# Patient Record
Sex: Female | Born: 1950
Health system: Southern US, Community
[De-identification: ages and names within clinical notes are randomized; demographics above are authoritative.]

## PROBLEM LIST (undated history)

## (undated) DIAGNOSIS — E049 Nontoxic goiter, unspecified: Secondary | ICD-10-CM

## (undated) DIAGNOSIS — C7A012 Malignant carcinoid tumor of the ileum: Secondary | ICD-10-CM

## (undated) HISTORY — PX: APPENDECTOMY: SHX54

## (undated) HISTORY — PX: COLPOSCOPY: SHX161

## (undated) HISTORY — DX: Nontoxic goiter, unspecified: E04.9

## (undated) HISTORY — DX: Malignant carcinoid tumor of the ileum: C7A.012

## (undated) HISTORY — PX: THYROIDECTOMY: SHX17

## (undated) HISTORY — PX: OVARIAN CYST REMOVAL: SHX89

---

## 2004-08-21 ENCOUNTER — Encounter: Admission: RE | Admit: 2004-08-21 | Discharge: 2004-08-21 | Payer: Self-pay | Admitting: Obstetrics and Gynecology

## 2004-09-28 ENCOUNTER — Ambulatory Visit (HOSPITAL_COMMUNITY): Admission: RE | Admit: 2004-09-28 | Discharge: 2004-09-28 | Payer: Self-pay | Admitting: Endocrinology

## 2005-09-30 ENCOUNTER — Encounter: Admission: RE | Admit: 2005-09-30 | Discharge: 2005-09-30 | Payer: Self-pay | Admitting: Obstetrics and Gynecology

## 2006-10-02 ENCOUNTER — Encounter: Admission: RE | Admit: 2006-10-02 | Discharge: 2006-10-02 | Payer: Self-pay | Admitting: Obstetrics and Gynecology

## 2007-10-05 ENCOUNTER — Encounter: Admission: RE | Admit: 2007-10-05 | Discharge: 2007-10-05 | Payer: Self-pay | Admitting: Obstetrics and Gynecology

## 2007-10-19 ENCOUNTER — Encounter: Admission: RE | Admit: 2007-10-19 | Discharge: 2007-10-19 | Payer: Self-pay | Admitting: Gastroenterology

## 2007-11-18 ENCOUNTER — Encounter: Admission: RE | Admit: 2007-11-18 | Discharge: 2007-11-18 | Payer: Self-pay | Admitting: Obstetrics and Gynecology

## 2007-11-24 ENCOUNTER — Encounter: Admission: RE | Admit: 2007-11-24 | Discharge: 2007-11-24 | Payer: Self-pay | Admitting: Internal Medicine

## 2007-12-10 ENCOUNTER — Other Ambulatory Visit: Admission: RE | Admit: 2007-12-10 | Discharge: 2007-12-10 | Payer: Self-pay | Admitting: Interventional Radiology

## 2007-12-10 ENCOUNTER — Encounter: Admission: RE | Admit: 2007-12-10 | Discharge: 2007-12-10 | Payer: Self-pay | Admitting: Internal Medicine

## 2007-12-10 ENCOUNTER — Encounter (INDEPENDENT_AMBULATORY_CARE_PROVIDER_SITE_OTHER): Payer: Self-pay | Admitting: Interventional Radiology

## 2008-01-11 ENCOUNTER — Encounter (INDEPENDENT_AMBULATORY_CARE_PROVIDER_SITE_OTHER): Payer: Self-pay | Admitting: General Surgery

## 2008-01-11 ENCOUNTER — Inpatient Hospital Stay (HOSPITAL_COMMUNITY): Admission: RE | Admit: 2008-01-11 | Discharge: 2008-01-16 | Payer: Self-pay | Admitting: General Surgery

## 2008-01-29 ENCOUNTER — Ambulatory Visit: Payer: Self-pay | Admitting: Hematology and Oncology

## 2008-02-05 LAB — CBC WITH DIFFERENTIAL/PLATELET
BASO%: 0.6 % (ref 0.0–2.0)
Basophils Absolute: 0 10*3/uL (ref 0.0–0.1)
EOS%: 1.6 % (ref 0.0–7.0)
Eosinophils Absolute: 0.1 10*3/uL (ref 0.0–0.5)
HCT: 37.3 % (ref 34.8–46.6)
HGB: 12.7 g/dL (ref 11.6–15.9)
LYMPH%: 30.4 % (ref 14.0–48.0)
MCH: 31.1 pg (ref 26.0–34.0)
MCHC: 34.1 g/dL (ref 32.0–36.0)
MCV: 91.1 fL (ref 81.0–101.0)
MONO#: 0.5 10*3/uL (ref 0.1–0.9)
MONO%: 7.8 % (ref 0.0–13.0)
NEUT#: 3.7 10*3/uL (ref 1.5–6.5)
NEUT%: 59.6 % (ref 39.6–76.8)
Platelets: 238 10*3/uL (ref 145–400)
RBC: 4.09 10*6/uL (ref 3.70–5.32)
RDW: 13.4 % (ref 11.3–14.5)
WBC: 6.2 10*3/uL (ref 3.9–10.0)
lymph#: 1.9 10*3/uL (ref 0.9–3.3)

## 2008-02-05 LAB — COMPREHENSIVE METABOLIC PANEL
ALT: 15 U/L (ref 0–35)
AST: 14 U/L (ref 0–37)
Albumin: 4.4 g/dL (ref 3.5–5.2)
Alkaline Phosphatase: 60 U/L (ref 39–117)
BUN: 8 mg/dL (ref 6–23)
CO2: 25 mEq/L (ref 19–32)
Calcium: 9 mg/dL (ref 8.4–10.5)
Chloride: 105 mEq/L (ref 96–112)
Creatinine, Ser: 0.76 mg/dL (ref 0.40–1.20)
Glucose, Bld: 86 mg/dL (ref 70–99)
Potassium: 3.7 mEq/L (ref 3.5–5.3)
Sodium: 137 mEq/L (ref 135–145)
Total Bilirubin: 0.4 mg/dL (ref 0.3–1.2)
Total Protein: 7.5 g/dL (ref 6.0–8.3)

## 2008-02-08 ENCOUNTER — Ambulatory Visit (HOSPITAL_COMMUNITY): Admission: RE | Admit: 2008-02-08 | Discharge: 2008-02-08 | Payer: Self-pay | Admitting: Hematology and Oncology

## 2008-02-11 LAB — 5 HIAA, QUANTITATIVE, URINE, 24 HOUR
5-HIAA, 24 Hr Urine: 2.4 mg/24 h (ref <=6.0)
Volume, Urine-5HIAA: 1400 mL/24 h

## 2008-02-23 ENCOUNTER — Ambulatory Visit (HOSPITAL_COMMUNITY): Admission: RE | Admit: 2008-02-23 | Discharge: 2008-02-23 | Payer: Self-pay | Admitting: Hematology and Oncology

## 2008-02-26 ENCOUNTER — Encounter: Admission: RE | Admit: 2008-02-26 | Discharge: 2008-02-26 | Payer: Self-pay | Admitting: Gastroenterology

## 2008-03-06 ENCOUNTER — Emergency Department (HOSPITAL_COMMUNITY): Admission: EM | Admit: 2008-03-06 | Discharge: 2008-03-06 | Payer: Self-pay | Admitting: Emergency Medicine

## 2008-07-26 ENCOUNTER — Ambulatory Visit: Payer: Self-pay | Admitting: Hematology and Oncology

## 2008-07-28 LAB — COMPREHENSIVE METABOLIC PANEL
ALT: 12 U/L (ref 0–35)
AST: 16 U/L (ref 0–37)
Albumin: 4.3 g/dL (ref 3.5–5.2)
Alkaline Phosphatase: 63 U/L (ref 39–117)
BUN: 12 mg/dL (ref 6–23)
CO2: 27 mEq/L (ref 19–32)
Calcium: 9.3 mg/dL (ref 8.4–10.5)
Chloride: 103 mEq/L (ref 96–112)
Creatinine, Ser: 0.76 mg/dL (ref 0.40–1.20)
Glucose, Bld: 87 mg/dL (ref 70–99)
Potassium: 4.2 mEq/L (ref 3.5–5.3)
Sodium: 139 mEq/L (ref 135–145)
Total Bilirubin: 0.6 mg/dL (ref 0.3–1.2)
Total Protein: 7.3 g/dL (ref 6.0–8.3)

## 2008-07-28 LAB — CBC WITH DIFFERENTIAL/PLATELET
BASO%: 0.2 % (ref 0.0–2.0)
Basophils Absolute: 0 10*3/uL (ref 0.0–0.1)
EOS%: 1.3 % (ref 0.0–7.0)
Eosinophils Absolute: 0.1 10*3/uL (ref 0.0–0.5)
HCT: 37.3 % (ref 34.8–46.6)
HGB: 12.9 g/dL (ref 11.6–15.9)
LYMPH%: 35.2 % (ref 14.0–49.7)
MCH: 30.6 pg (ref 25.1–34.0)
MCHC: 34.6 g/dL (ref 31.5–36.0)
MCV: 88.6 fL (ref 79.5–101.0)
MONO#: 0.5 10*3/uL (ref 0.1–0.9)
MONO%: 8.9 % (ref 0.0–14.0)
NEUT#: 2.9 10*3/uL (ref 1.5–6.5)
NEUT%: 54.4 % (ref 38.4–76.8)
Platelets: 214 10*3/uL (ref 145–400)
RBC: 4.21 10*6/uL (ref 3.70–5.45)
RDW: 13.2 % (ref 11.2–14.5)
WBC: 5.3 10*3/uL (ref 3.9–10.3)
lymph#: 1.9 10*3/uL (ref 0.9–3.3)

## 2008-07-28 LAB — LACTATE DEHYDROGENASE: LDH: 163 U/L (ref 94–250)

## 2008-07-31 LAB — 5 HIAA, QUANTITATIVE, URINE, 24 HOUR
5-HIAA, 24 Hr Urine: 4 mg/24 h (ref <=6.0)
Volume, Urine-5HIAA: 1500 mL/24 h

## 2008-08-08 ENCOUNTER — Ambulatory Visit (HOSPITAL_COMMUNITY): Admission: RE | Admit: 2008-08-08 | Discharge: 2008-08-08 | Payer: Self-pay | Admitting: Hematology and Oncology

## 2008-10-07 ENCOUNTER — Encounter: Admission: RE | Admit: 2008-10-07 | Discharge: 2008-10-07 | Payer: Self-pay | Admitting: Obstetrics and Gynecology

## 2009-02-02 ENCOUNTER — Ambulatory Visit: Payer: Self-pay | Admitting: Hematology and Oncology

## 2009-02-06 ENCOUNTER — Ambulatory Visit (HOSPITAL_COMMUNITY): Admission: RE | Admit: 2009-02-06 | Discharge: 2009-02-06 | Payer: Self-pay | Admitting: Hematology and Oncology

## 2009-02-06 LAB — CBC WITH DIFFERENTIAL/PLATELET
BASO%: 0.2 % (ref 0.0–2.0)
Basophils Absolute: 0 10*3/uL (ref 0.0–0.1)
EOS%: 1.7 % (ref 0.0–7.0)
Eosinophils Absolute: 0.1 10*3/uL (ref 0.0–0.5)
HCT: 38.6 % (ref 34.8–46.6)
HGB: 13.1 g/dL (ref 11.6–15.9)
LYMPH%: 32 % (ref 14.0–49.7)
MCH: 30.6 pg (ref 25.1–34.0)
MCHC: 33.9 g/dL (ref 31.5–36.0)
MCV: 90.2 fL (ref 79.5–101.0)
MONO#: 0.4 10*3/uL (ref 0.1–0.9)
MONO%: 9.4 % (ref 0.0–14.0)
NEUT#: 2.3 10*3/uL (ref 1.5–6.5)
NEUT%: 56.7 % (ref 38.4–76.8)
Platelets: 211 10*3/uL (ref 145–400)
RBC: 4.28 10*6/uL (ref 3.70–5.45)
RDW: 13 % (ref 11.2–14.5)
WBC: 4.1 10*3/uL (ref 3.9–10.3)
lymph#: 1.3 10*3/uL (ref 0.9–3.3)

## 2009-02-06 LAB — COMPREHENSIVE METABOLIC PANEL
ALT: 20 U/L (ref 0–35)
AST: 20 U/L (ref 0–37)
Albumin: 3.8 g/dL (ref 3.5–5.2)
Alkaline Phosphatase: 61 U/L (ref 39–117)
BUN: 6 mg/dL (ref 6–23)
CO2: 30 mEq/L (ref 19–32)
Calcium: 9.5 mg/dL (ref 8.4–10.5)
Chloride: 106 mEq/L (ref 96–112)
Creatinine, Ser: 0.77 mg/dL (ref 0.40–1.20)
Glucose, Bld: 77 mg/dL (ref 70–99)
Potassium: 3.9 mEq/L (ref 3.5–5.3)
Sodium: 142 mEq/L (ref 135–145)
Total Bilirubin: 0.7 mg/dL (ref 0.3–1.2)
Total Protein: 7.7 g/dL (ref 6.0–8.3)

## 2009-02-09 LAB — 5 HIAA, QUANTITATIVE, URINE, 24 HOUR
5-HIAA, 24 Hr Urine: 3.7 mg/24 h (ref <=6.0)
Volume, Urine-5HIAA: 1325 mL/24 h

## 2009-02-15 LAB — CHROMOGRANIN A: Chromogranin A: 4.6 ng/mL (ref 1.9–15.0)

## 2009-04-26 ENCOUNTER — Ambulatory Visit: Payer: Self-pay | Admitting: Hematology and Oncology

## 2009-05-03 LAB — CBC WITH DIFFERENTIAL/PLATELET
BASO%: 0.4 % (ref 0.0–2.0)
Basophils Absolute: 0 10*3/uL (ref 0.0–0.1)
EOS%: 2.5 % (ref 0.0–7.0)
Eosinophils Absolute: 0.1 10*3/uL (ref 0.0–0.5)
HCT: 35.9 % (ref 34.8–46.6)
HGB: 12.4 g/dL (ref 11.6–15.9)
LYMPH%: 29.9 % (ref 14.0–49.7)
MCH: 32.3 pg (ref 25.1–34.0)
MCHC: 34.5 g/dL (ref 31.5–36.0)
MCV: 93.7 fL (ref 79.5–101.0)
MONO#: 0.5 10*3/uL (ref 0.1–0.9)
MONO%: 10.5 % (ref 0.0–14.0)
NEUT#: 2.6 10*3/uL (ref 1.5–6.5)
NEUT%: 56.7 % (ref 38.4–76.8)
Platelets: 226 10*3/uL (ref 145–400)
RBC: 3.83 10*6/uL (ref 3.70–5.45)
RDW: 13.4 % (ref 11.2–14.5)
WBC: 4.6 10*3/uL (ref 3.9–10.3)
lymph#: 1.4 10*3/uL (ref 0.9–3.3)

## 2009-05-08 LAB — COMPREHENSIVE METABOLIC PANEL
ALT: 16 U/L (ref 0–35)
AST: 15 U/L (ref 0–37)
Albumin: 3.9 g/dL (ref 3.5–5.2)
Alkaline Phosphatase: 55 U/L (ref 39–117)
BUN: 11 mg/dL (ref 6–23)
CO2: 25 mEq/L (ref 19–32)
Calcium: 8.8 mg/dL (ref 8.4–10.5)
Chloride: 106 mEq/L (ref 96–112)
Creatinine, Ser: 0.71 mg/dL (ref 0.40–1.20)
Glucose, Bld: 82 mg/dL (ref 70–99)
Potassium: 4 mEq/L (ref 3.5–5.3)
Sodium: 138 mEq/L (ref 135–145)
Total Bilirubin: 0.6 mg/dL (ref 0.3–1.2)
Total Protein: 7 g/dL (ref 6.0–8.3)

## 2009-05-08 LAB — CHROMOGRANIN A: Chromogranin A: 4.2 ng/mL (ref 1.9–15.0)

## 2009-05-11 LAB — 5 HIAA, QUANTITATIVE, URINE, 24 HOUR
5-HIAA, 24 Hr Urine: 5.8 mg/24 h (ref <=6.0)
Volume, Urine-5HIAA: 800 mL/24 h

## 2009-07-14 ENCOUNTER — Ambulatory Visit: Payer: Self-pay | Admitting: Hematology and Oncology

## 2009-07-18 ENCOUNTER — Ambulatory Visit (HOSPITAL_COMMUNITY): Admission: RE | Admit: 2009-07-18 | Discharge: 2009-07-18 | Payer: Self-pay | Admitting: Hematology and Oncology

## 2009-07-18 LAB — COMPREHENSIVE METABOLIC PANEL
ALT: 24 U/L (ref 0–35)
AST: 37 U/L (ref 0–37)
Albumin: 3.9 g/dL (ref 3.5–5.2)
Alkaline Phosphatase: 57 U/L (ref 39–117)
BUN: 10 mg/dL (ref 6–23)
CO2: 29 mEq/L (ref 19–32)
Calcium: 8.9 mg/dL (ref 8.4–10.5)
Chloride: 103 mEq/L (ref 96–112)
Creatinine, Ser: 0.71 mg/dL (ref 0.40–1.20)
Glucose, Bld: 83 mg/dL (ref 70–99)
Potassium: 3.7 mEq/L (ref 3.5–5.3)
Sodium: 136 mEq/L (ref 135–145)
Total Bilirubin: 0.6 mg/dL (ref 0.3–1.2)
Total Protein: 7.9 g/dL (ref 6.0–8.3)

## 2009-07-18 LAB — CBC WITH DIFFERENTIAL/PLATELET
BASO%: 0.4 % (ref 0.0–2.0)
Basophils Absolute: 0 10*3/uL (ref 0.0–0.1)
EOS%: 0.6 % (ref 0.0–7.0)
Eosinophils Absolute: 0 10*3/uL (ref 0.0–0.5)
HCT: 38.6 % (ref 34.8–46.6)
HGB: 13 g/dL (ref 11.6–15.9)
LYMPH%: 32.5 % (ref 14.0–49.7)
MCH: 31.7 pg (ref 25.1–34.0)
MCHC: 33.8 g/dL (ref 31.5–36.0)
MCV: 94 fL (ref 79.5–101.0)
MONO#: 0.4 10*3/uL (ref 0.1–0.9)
MONO%: 8.1 % (ref 0.0–14.0)
NEUT#: 2.7 10*3/uL (ref 1.5–6.5)
NEUT%: 58.4 % (ref 38.4–76.8)
Platelets: 220 10*3/uL (ref 145–400)
RBC: 4.1 10*6/uL (ref 3.70–5.45)
RDW: 13.8 % (ref 11.2–14.5)
WBC: 4.6 10*3/uL (ref 3.9–10.3)
lymph#: 1.5 10*3/uL (ref 0.9–3.3)

## 2009-07-23 LAB — CHROMOGRANIN A: Chromogranin A: 7 ng/mL (ref 1.9–15.0)

## 2009-08-04 LAB — 5 HIAA, QUANTITATIVE, URINE, 24 HOUR
5-HIAA, 24 Hr Urine: 3.6 mg/24 h (ref <=6.0)
Volume, Urine-5HIAA: 1575 mL/24 h

## 2009-10-10 ENCOUNTER — Encounter: Admission: RE | Admit: 2009-10-10 | Discharge: 2009-10-10 | Payer: Self-pay | Admitting: Obstetrics and Gynecology

## 2010-02-07 ENCOUNTER — Ambulatory Visit: Payer: Self-pay | Admitting: Hematology and Oncology

## 2010-02-15 LAB — 5 HIAA, QUANTITATIVE, URINE, 24 HOUR
5-HIAA, 24 Hr Urine: 4.4 mg/24 h (ref <=6.0)
Volume, Urine-5HIAA: 1750 mL/24 h

## 2010-02-16 LAB — CBC WITH DIFFERENTIAL/PLATELET
BASO%: 0.3 % (ref 0.0–2.0)
Basophils Absolute: 0 10*3/uL (ref 0.0–0.1)
EOS%: 0.8 % (ref 0.0–7.0)
Eosinophils Absolute: 0 10*3/uL (ref 0.0–0.5)
HCT: 37.4 % (ref 34.8–46.6)
HGB: 12.9 g/dL (ref 11.6–15.9)
LYMPH%: 35 % (ref 14.0–49.7)
MCH: 32.2 pg (ref 25.1–34.0)
MCHC: 34.5 g/dL (ref 31.5–36.0)
MCV: 93.4 fL (ref 79.5–101.0)
MONO#: 0.5 10*3/uL (ref 0.1–0.9)
MONO%: 9.7 % (ref 0.0–14.0)
NEUT#: 2.8 10*3/uL (ref 1.5–6.5)
NEUT%: 54.2 % (ref 38.4–76.8)
Platelets: 224 10*3/uL (ref 145–400)
RBC: 4.01 10*6/uL (ref 3.70–5.45)
RDW: 13.6 % (ref 11.2–14.5)
WBC: 5.1 10*3/uL (ref 3.9–10.3)
lymph#: 1.8 10*3/uL (ref 0.9–3.3)

## 2010-02-20 LAB — COMPREHENSIVE METABOLIC PANEL
ALT: 15 U/L (ref 0–35)
AST: 15 U/L (ref 0–37)
Albumin: 4.6 g/dL (ref 3.5–5.2)
Alkaline Phosphatase: 66 U/L (ref 39–117)
BUN: 11 mg/dL (ref 6–23)
CO2: 28 mEq/L (ref 19–32)
Calcium: 9 mg/dL (ref 8.4–10.5)
Chloride: 103 mEq/L (ref 96–112)
Creatinine, Ser: 0.74 mg/dL (ref 0.40–1.20)
Glucose, Bld: 85 mg/dL (ref 70–99)
Potassium: 4 mEq/L (ref 3.5–5.3)
Sodium: 138 mEq/L (ref 135–145)
Total Bilirubin: 0.4 mg/dL (ref 0.3–1.2)
Total Protein: 7.6 g/dL (ref 6.0–8.3)

## 2010-02-20 LAB — CHROMOGRANIN A: Chromogranin A: 10.2 ng/mL (ref 1.9–15.0)

## 2010-02-20 LAB — LACTATE DEHYDROGENASE: LDH: 164 U/L (ref 94–250)

## 2010-03-19 ENCOUNTER — Ambulatory Visit: Payer: Self-pay | Admitting: Hematology and Oncology

## 2010-05-04 ENCOUNTER — Other Ambulatory Visit: Payer: Self-pay | Admitting: Hematology and Oncology

## 2010-05-04 DIAGNOSIS — C7A8 Other malignant neuroendocrine tumors: Secondary | ICD-10-CM

## 2010-08-28 NOTE — Discharge Summary (Signed)
[name], [name] NO.:  [id number]   MEDICAL RECORD NO.:  [id number]          PATIENT TYPE:  INP   LOCATION:  5152                         FACILITY:  [facility]   PHYSICIAN:  Cherylynn Ridges, M.D.    DATE OF BIRTH:  06-Oct-1950   DATE OF ADMISSION:  01/11/2008  DATE OF DISCHARGE:  01/16/2008                               DISCHARGE SUMMARY   DISCHARGE DIAGNOSIS:  Neuroendocrine tumor of the terminal ileum by Dr.  Lindie Spruce.   ADDITIONAL DIAGNOSIS:  None.   She has had surgery.  Principal procedure on this admission was a right  colectomy, partial, by Dr. Lindie Spruce.  She was discharged home in the care  of her family.   Discharge medications include all preop medications in addition to  Vicodin 1-2 every 4 hours as needed for pain.  She is to follow up to  see Dr. Lindie Spruce in 1 week.   BRIEF SUMMARY AND HOSPITAL COURSE:  Samantha Martinez was admitted for what  was known to be a likely neuroendocrine tumor of the terminal ileum,  picked up on endoscopy.  Postoperatively, she did well.  Pathology  demonstrated this to be a neuroendocrine tumor with 1/6 lymph nodes  positive for metastasis.  Postop course was unremarkable.  On postop day  #1, she was mobilizing, taking clear liquids.  On postop day #2, she  continued with clear liquids, was advanced to full liquids by postop day  #3.  By postop day #4, she was put on a soft diet, and she was  discharged home on postop day #5.  She will return to clinic to see me  in 1-2 weeks.      Cherylynn Ridges, M.D.  Electronically Signed     JOW/MEDQ  D:  03/03/2008  T:  03/04/2008  Job:  [id number]   cc:   Jordan Hawks. Elnoria Howard, MD

## 2010-08-28 NOTE — Op Note (Signed)
NAMEDELENN, AHN NO.:  [id number]   MEDICAL RECORD NO.:  [id number]          PATIENT TYPE:  INP   LOCATION:  5152                         FACILITY:  [facility]   PHYSICIAN:  Cherylynn Ridges, M.D.    DATE OF BIRTH:  1950/04/21   DATE OF PROCEDURE:  01/11/2008  DATE OF DISCHARGE:                               OPERATIVE REPORT   PREOPERATIVE DIAGNOSIS:  Carcinoid tumor of the terminal ileum.   POSTOPERATIVE DIAGNOSIS:  Carcinoid tumor of the terminal ileum.   PROCEDURE:  Resection of terminal ileum and cecum with primary  anastomosis.   SURGEON:  Marta Lamas. Lindie Spruce, MD   ASSISTANT:  Anselm Pancoast. Weatherly, MD   ANESTHESIA:  General endotracheal.   ESTIMATED BLOOD LOSS:  Less than 50 mL.   COMPLICATIONS:  None.   CONDITION:  Stable.   INDICATIONS FOR OPERATION:  The patient is a 60 year old female who  during a routine colonoscopy was found to have a carcinoid tumor of the  terminal ileum.  She is not in symptomatic.   FINDINGS:  The patient had a very obvious tumor at the junction of the  terminal ileum at the ileocecal valve.  Resection of the cecum and  terminal ileum were required.   The liver appeared to be without evidence of any tumor.  There was no  obvious pathologic adenopathy.   OPERATION:  The patient was taken to the operating room and placed on  the table in supine position.  After an adequate general endotracheal  anesthetic was administered, she was prepped and draped in usual sterile  manner exposing the right lower quadrant and the midline in the abdomen.   We made a transverse incision from just lateral to the umbilicus  laterally.  We took it down to and through the subcutaneous tissue  through the anterior rectus sheath, the rectus muscle, and then the  posterior rectus sheath; once it had been opened easily and without  event to the peritoneal cavity.   We used primarily Richardson retractors in order to get adequate  visualization  and mobilization of the cecum and the terminal ileum.  This was done without much effort as we mobilized the terminal ileum  where there was no appendix noted.  We mobilized the terminal ileum and  the cecum and immediately noted a sort of dimpling-type tumor at the  junction of the mesentery and the terminal ileum.  We mobilized this  area, came across the terminal ileum distal and proximal to the tumor by  about 10 cm using a GIA-75 stapler and I came across the proximal  ascending colon with the same type of stapler.   The mesentery was taken with a Kelly clamps and 2-0 silk ties.  We  removed the cecum and the terminal ileum and obtained adequate  hemostasis in the mesentery using ties.  We then did a functional side-  to-side and end-to-end anastomosis using a GIA-75 stapler and then a TA-  60 stapler to close off the resulting enterotomy.   The mesentery was closed using 2-0 silk sutures.  There was a single  lymph  node, which seem to pop out of the mesentery, which did not appear  to be pathologic, but because it presented itself easily, it was sent as  a separate specimen.   Once we had performed anastomosis and there was  no twisting in the  bowel at the point of the anastomosis, we irrigated with saline  solution.  We closed the posterior and anterior sheath using running and  looped PDS suture.  There was a #1 looped PDS suture.  Then, the subcu  was irrigated with saline and the skin was injected with 0.5% Marcaine  with epi.  We then closed the skin using running subcuticular stitch of  4-0 Monocryl.  Steri-Strips, Dermabond, and Tegaderm were applied.  All  needle counts, sponge counts, and instrument counts were correct.      Cherylynn Ridges, M.D.  Electronically Signed     JOW/MEDQ  D:  01/11/2008  T:  01/12/2008  Job:  [id number]   cc:   Jordan Hawks. Elnoria Howard, MD

## 2010-09-18 ENCOUNTER — Other Ambulatory Visit: Payer: Self-pay | Admitting: Obstetrics and Gynecology

## 2010-09-18 DIAGNOSIS — Z1231 Encounter for screening mammogram for malignant neoplasm of breast: Secondary | ICD-10-CM

## 2010-09-21 ENCOUNTER — Other Ambulatory Visit (HOSPITAL_COMMUNITY): Payer: Self-pay

## 2010-10-02 ENCOUNTER — Other Ambulatory Visit: Payer: Self-pay | Admitting: Hematology and Oncology

## 2010-10-02 ENCOUNTER — Encounter (HOSPITAL_BASED_OUTPATIENT_CLINIC_OR_DEPARTMENT_OTHER): Payer: BC Managed Care – PPO | Admitting: Hematology and Oncology

## 2010-10-02 ENCOUNTER — Ambulatory Visit (HOSPITAL_COMMUNITY)
Admission: RE | Admit: 2010-10-02 | Discharge: 2010-10-02 | Disposition: A | Discharge status: Home / Self Care | Payer: BC Managed Care – PPO | Source: Ambulatory Visit | Attending: Hematology and Oncology | Admitting: Hematology and Oncology

## 2010-10-02 DIAGNOSIS — C7A012 Malignant carcinoid tumor of the ileum: Secondary | ICD-10-CM

## 2010-10-02 DIAGNOSIS — C7A8 Other malignant neuroendocrine tumors: Secondary | ICD-10-CM

## 2010-10-02 DIAGNOSIS — C7A019 Malignant carcinoid tumor of the small intestine, unspecified portion: Secondary | ICD-10-CM | POA: Insufficient documentation

## 2010-10-02 DIAGNOSIS — R109 Unspecified abdominal pain: Secondary | ICD-10-CM | POA: Insufficient documentation

## 2010-10-02 LAB — CBC WITH DIFFERENTIAL/PLATELET
BASO%: 0.3 % (ref 0.0–2.0)
Basophils Absolute: 0 10*3/uL (ref 0.0–0.1)
EOS%: 1.4 % (ref 0.0–7.0)
Eosinophils Absolute: 0.1 10*3/uL (ref 0.0–0.5)
HCT: 37.9 % (ref 34.8–46.6)
HGB: 12.9 g/dL (ref 11.6–15.9)
LYMPH%: 28.3 % (ref 14.0–49.7)
MCH: 31.6 pg (ref 25.1–34.0)
MCHC: 33.9 g/dL (ref 31.5–36.0)
MCV: 93.3 fL (ref 79.5–101.0)
MONO#: 0.5 10*3/uL (ref 0.1–0.9)
MONO%: 9 % (ref 0.0–14.0)
NEUT#: 3.3 10*3/uL (ref 1.5–6.5)
NEUT%: 61 % (ref 38.4–76.8)
Platelets: 196 10*3/uL (ref 145–400)
RBC: 4.06 10*6/uL (ref 3.70–5.45)
RDW: 13.5 % (ref 11.2–14.5)
WBC: 5.4 10*3/uL (ref 3.9–10.3)
lymph#: 1.5 10*3/uL (ref 0.9–3.3)

## 2010-10-02 LAB — CMP (CANCER CENTER ONLY)
ALT(SGPT): 18 U/L (ref 10–47)
AST: 24 U/L (ref 11–38)
Albumin: 3.5 g/dL (ref 3.3–5.5)
Alkaline Phosphatase: 71 U/L (ref 26–84)
BUN, Bld: 11 mg/dL (ref 7–22)
CO2: 29 mEq/L (ref 18–33)
Calcium: 9 mg/dL (ref 8.0–10.3)
Chloride: 103 mEq/L (ref 98–108)
Creat: 0.6 mg/dl (ref 0.6–1.2)
Glucose, Bld: 93 mg/dL (ref 73–118)
Potassium: 4.2 mEq/L (ref 3.3–4.7)
Sodium: 140 mEq/L (ref 128–145)
Total Bilirubin: 0.5 mg/dl (ref 0.20–1.60)
Total Protein: 8.1 g/dL (ref 6.4–8.1)

## 2010-10-02 MED ORDER — IOHEXOL 300 MG/ML  SOLN
100.0000 mL | Freq: Once | INTRAMUSCULAR | Status: AC | PRN
  Administered 2010-10-02: 100 mL via INTRAVENOUS

## 2010-10-04 ENCOUNTER — Other Ambulatory Visit: Payer: Self-pay | Admitting: Hematology and Oncology

## 2010-10-04 LAB — CHROMOGRANIN A: Chromogranin A: 28 ng/mL — ABNORMAL HIGH (ref 1.9–15.0)

## 2010-10-08 LAB — 5 HIAA, QUANTITATIVE, URINE, 24 HOUR
5-HIAA, 24 Hr Urine: 4.6 mg/24 h (ref <=6.0)
Volume, Urine-5HIAA: 1450 mL/24 h

## 2010-10-10 ENCOUNTER — Encounter: Payer: BC Managed Care – PPO | Admitting: Hematology and Oncology

## 2010-10-19 ENCOUNTER — Ambulatory Visit
Admission: RE | Admit: 2010-10-19 | Discharge: 2010-10-19 | Disposition: A | Discharge status: Home / Self Care | Payer: BC Managed Care – PPO | Source: Ambulatory Visit | Attending: Obstetrics and Gynecology | Admitting: Obstetrics and Gynecology

## 2010-10-19 ENCOUNTER — Ambulatory Visit: Payer: Self-pay

## 2010-10-19 DIAGNOSIS — Z1231 Encounter for screening mammogram for malignant neoplasm of breast: Secondary | ICD-10-CM

## 2011-01-14 LAB — COMPREHENSIVE METABOLIC PANEL
ALT: 22
AST: 22
Albumin: 4.1
Alkaline Phosphatase: 55
BUN: 9
CO2: 29
Calcium: 9.2
Chloride: 106
Creatinine, Ser: 0.78
GFR calc Af Amer: >60
GFR calc non Af Amer: >60
Glucose, Bld: 109 — ABNORMAL HIGH
Potassium: 3.9
Sodium: 140
Total Bilirubin: 0.8
Total Protein: 7.3

## 2011-01-14 LAB — CBC
HCT: 33.6 — ABNORMAL LOW
HCT: 39.1
Hemoglobin: 11.5 — ABNORMAL LOW
Hemoglobin: 13.2
MCHC: 33.8
MCHC: 34.3
MCV: 91.6
MCV: 92.2
Platelets: 193
Platelets: 249
RBC: 3.64 — ABNORMAL LOW
RBC: 4.27
RDW: 13.2
RDW: 13.3
WBC: 7.4
WBC: 9

## 2011-01-14 LAB — BASIC METABOLIC PANEL
BUN: 1 — ABNORMAL LOW
CO2: 29
Calcium: 8.6
Chloride: 103
Creatinine, Ser: 0.67
GFR calc Af Amer: >60
GFR calc non Af Amer: >60
Glucose, Bld: 117 — ABNORMAL HIGH
Potassium: 3.4 — ABNORMAL LOW
Sodium: 139

## 2011-01-14 LAB — DIFFERENTIAL
Basophils Absolute: 0
Basophils Relative: 0
Eosinophils Absolute: 0
Eosinophils Relative: 0
Lymphocytes Relative: 34
Lymphs Abs: 2.5
Monocytes Absolute: 0.6
Monocytes Relative: 8
Neutro Abs: 4.2
Neutrophils Relative %: 57

## 2011-01-14 LAB — CEA: CEA: <0.5

## 2011-01-15 LAB — COMPREHENSIVE METABOLIC PANEL
ALT: 14
AST: 17
Albumin: 4.1
Alkaline Phosphatase: 73
BUN: 8
CO2: 28
Calcium: 9.4
Chloride: 106
Creatinine, Ser: 0.69
GFR calc Af Amer: >60
GFR calc non Af Amer: >60
Glucose, Bld: 102 — ABNORMAL HIGH
Potassium: 3.5
Sodium: 140
Total Bilirubin: 0.6
Total Protein: 7.5

## 2011-01-15 LAB — CBC
HCT: 34.1 — ABNORMAL LOW
HCT: 39.4
Hemoglobin: 11.3 — ABNORMAL LOW
Hemoglobin: 13.2
MCHC: 33.1
MCHC: 33.4
MCV: 93.8
MCV: 93
Platelets: 226
Platelets: 255
RBC: 3.64 — ABNORMAL LOW
RBC: 4.24
RDW: 12.9
RDW: 14.2
WBC: 6.1
WBC: 8.8

## 2011-01-15 LAB — BASIC METABOLIC PANEL
BUN: 4 — ABNORMAL LOW
CO2: 30
Calcium: 9
Chloride: 100
Creatinine, Ser: 0.62
GFR calc Af Amer: >60
GFR calc non Af Amer: >60
Glucose, Bld: 87
Potassium: 3.5
Sodium: 137

## 2011-01-15 LAB — DIFFERENTIAL
Basophils Absolute: 0
Basophils Relative: 0
Eosinophils Absolute: 0
Eosinophils Relative: 1
Lymphocytes Relative: 18
Lymphs Abs: 1.5
Monocytes Absolute: 0.7
Monocytes Relative: 8
Neutro Abs: 6.5
Neutrophils Relative %: 74

## 2011-01-15 LAB — URINALYSIS, ROUTINE W REFLEX MICROSCOPIC
Bilirubin Urine: NEGATIVE
Glucose, UA: NEGATIVE
Hgb urine dipstick: NEGATIVE
Ketones, ur: NEGATIVE
Nitrite: NEGATIVE
Protein, ur: NEGATIVE
Specific Gravity, Urine: 1.017
Urobilinogen, UA: 0.2
pH: 6

## 2011-01-15 LAB — LIPASE, BLOOD: Lipase: 45

## 2011-03-11 ENCOUNTER — Other Ambulatory Visit: Payer: Self-pay | Admitting: Hematology and Oncology

## 2011-03-11 ENCOUNTER — Other Ambulatory Visit (HOSPITAL_BASED_OUTPATIENT_CLINIC_OR_DEPARTMENT_OTHER): Payer: 59 | Admitting: Lab

## 2011-03-11 DIAGNOSIS — C7A012 Malignant carcinoid tumor of the ileum: Secondary | ICD-10-CM

## 2011-03-11 LAB — CBC WITH DIFFERENTIAL/PLATELET
BASO%: 0.3 % (ref 0.0–2.0)
Basophils Absolute: 0 10*3/uL (ref 0.0–0.1)
EOS%: 0.6 % (ref 0.0–7.0)
Eosinophils Absolute: 0 10*3/uL (ref 0.0–0.5)
HCT: 39.4 % (ref 34.8–46.6)
HGB: 13.3 g/dL (ref 11.6–15.9)
LYMPH%: 28.7 % (ref 14.0–49.7)
MCH: 31.4 pg (ref 25.1–34.0)
MCHC: 33.7 g/dL (ref 31.5–36.0)
MCV: 93.4 fL (ref 79.5–101.0)
MONO#: 0.4 10*3/uL (ref 0.1–0.9)
MONO%: 7.6 % (ref 0.0–14.0)
NEUT#: 3.3 10*3/uL (ref 1.5–6.5)
NEUT%: 62.8 % (ref 38.4–76.8)
Platelets: 226 10*3/uL (ref 145–400)
RBC: 4.22 10*6/uL (ref 3.70–5.45)
RDW: 13.4 % (ref 11.2–14.5)
WBC: 5.3 10*3/uL (ref 3.9–10.3)
lymph#: 1.5 10*3/uL (ref 0.9–3.3)

## 2011-03-15 LAB — CHROMOGRANIN A: Chromogranin A: 30 ng/mL — ABNORMAL HIGH (ref 1.9–15.0)

## 2011-03-15 LAB — COMPREHENSIVE METABOLIC PANEL
ALT: 16 U/L (ref 0–35)
AST: 19 U/L (ref 0–37)
Albumin: 4.1 g/dL (ref 3.5–5.2)
Alkaline Phosphatase: 65 U/L (ref 39–117)
BUN: 13 mg/dL (ref 6–23)
CO2: 30 mEq/L (ref 19–32)
Calcium: 9.1 mg/dL (ref 8.4–10.5)
Chloride: 104 mEq/L (ref 96–112)
Creatinine, Ser: 0.75 mg/dL (ref 0.50–1.10)
Glucose, Bld: 96 mg/dL (ref 70–99)
Potassium: 4.2 mEq/L (ref 3.5–5.3)
Sodium: 138 mEq/L (ref 135–145)
Total Bilirubin: 0.4 mg/dL (ref 0.3–1.2)
Total Protein: 7 g/dL (ref 6.0–8.3)

## 2011-03-16 LAB — 5 HIAA, QUANTITATIVE, URINE, 24 HOUR
5-HIAA, 24 Hr Urine: 5 mg/24 h (ref <=6.0)
Volume, Urine-5HIAA: 1500 mL/24 h

## 2011-03-20 ENCOUNTER — Ambulatory Visit: Payer: BC Managed Care – PPO | Admitting: Physician Assistant

## 2011-03-28 ENCOUNTER — Telehealth: Payer: Self-pay | Admitting: *Deleted

## 2011-03-28 NOTE — Telephone Encounter (Signed)
NOTIFIED DR.ODOGWU'S NURSE,THU BRAY,RN. PT. CALLED EARLIER TO SPEAK WITH DR.ODOGWU. A NOTE WAS PLACED ON DR.ODOGWU'S DESK. DR.ODOGWU WILL CALL PT.

## 2011-04-29 ENCOUNTER — Encounter: Payer: Self-pay | Admitting: *Deleted

## 2011-05-03 ENCOUNTER — Encounter: Payer: Self-pay | Admitting: Medical Oncology

## 2011-05-03 ENCOUNTER — Ambulatory Visit (HOSPITAL_BASED_OUTPATIENT_CLINIC_OR_DEPARTMENT_OTHER): Payer: 59 | Admitting: Hematology and Oncology

## 2011-05-03 ENCOUNTER — Telehealth: Payer: Self-pay | Admitting: Hematology and Oncology

## 2011-05-03 ENCOUNTER — Ambulatory Visit: Payer: 59

## 2011-05-03 VITALS — BP 124/91 | HR 91 | Temp 98.7°F | Ht 63.5 in | Wt 179.0 lb

## 2011-05-03 DIAGNOSIS — D3A00 Benign carcinoid tumor of unspecified site: Secondary | ICD-10-CM

## 2011-05-03 LAB — CBC WITH DIFFERENTIAL/PLATELET
BASO%: 0.4 % (ref 0.0–2.0)
Basophils Absolute: 0 10*3/uL (ref 0.0–0.1)
EOS%: 0.4 % (ref 0.0–7.0)
Eosinophils Absolute: 0 10*3/uL (ref 0.0–0.5)
HCT: 39.2 % (ref 34.8–46.6)
HGB: 13.4 g/dL (ref 11.6–15.9)
LYMPH%: 29 % (ref 14.0–49.7)
MCH: 31.6 pg (ref 25.1–34.0)
MCHC: 34.2 g/dL (ref 31.5–36.0)
MCV: 92.3 fL (ref 79.5–101.0)
MONO#: 0.5 10*3/uL (ref 0.1–0.9)
MONO%: 9.3 % (ref 0.0–14.0)
NEUT#: 3 10*3/uL (ref 1.5–6.5)
NEUT%: 60.9 % (ref 38.4–76.8)
Platelets: 226 10*3/uL (ref 145–400)
RBC: 4.25 10*6/uL (ref 3.70–5.45)
RDW: 13.4 % (ref 11.2–14.5)
WBC: 5 10*3/uL (ref 3.9–10.3)
lymph#: 1.4 10*3/uL (ref 0.9–3.3)

## 2011-05-03 NOTE — Telephone Encounter (Signed)
Gv pt appt for [id number] sent pt to lab. scheduled mri for 05/05/2011 @ 2pm @ [facility]

## 2011-05-03 NOTE — Progress Notes (Signed)
CC:   Samantha Martinez. Polite, M.D. Cherylynn Ridges, M.D. Jordan Hawks Elnoria Howard, MD  IDENTIFYING STATEMENT:  The patient is a 61 year old woman with a neuroendocrine tumor of the ileum who presents for followup.  INTERVAL HISTORY:  Samantha Martinez notes lower back pain.  She notes that this pain is worse on defecation.  She has not noted any blood. She has not lost any weight.  She received a colonoscopy October of last year which she notes was unremarkable.  I do not have those results to review at this time.  Last CAT scan in June of last year was essentially unremarkable.  MEDICATIONS:  Reviewed and updated.  ALLERGIES:  None.  REVIEW OF SYSTEMS:  Ten point review of systems negative.  PHYSICAL EXAM:  The patient is a well-appearing, well-nourished woman in no distress.  Vitals:  Pulse 91, blood pressure 124/91, temp 98.7, respirations 20, weight 179 pounds.  HEENT:  Head is atraumatic, normocephalic.  Sclerae anicteric.  Mouth moist.  Neck:  Supple.  Chest: Clear to percussion and auscultation.  CVS:  First and second heart sounds present.  No added sounds or murmurs.  Abdomen:  Soft, nontender. No masses.  Bowel sounds present.  Extremities:  No edema.  LAB DATA:  Obtained 2 months ago 02/2011 notes a white cell count of 5.3, hemoglobin 13.3, hematocrit 39.4, platelets 256.  Sodium 138, potassium 4.2, chloride 104, CO2 30, BUN 13, creatinine 0.75, glucose 96, t bilirubin 0.4, alkaline phosphatase 65, AST 19, ALT 62, calcium 9.1.  Chromogranin was 30, previously 28.  IMPRESSION AND PLAN:  Samantha Martinez is a 61 year old woman with a history of a low to intermediate grade neuroendocrine tumor of the ileum diagnosed September 2009.  She is status post resection of the ileum and cecum with primary anastomosis.  The tumor was 5 cm in size with no evidence of lymphovascular invasion.  One of the 6 lymph nodes was positive for tumor.  Samantha Martinez complains of pelvic/back  pain discomfort and a recent colonoscopy was unremarkable.  I have therefore scheduled an MRI of the pelvis with a chromogranin level.  We will call her with those results.  If all is well, she follows up in 6 months' time with labs only.    ______________________________ Laurice Record, M.D. LIO/MEDQ  D:  05/03/2011  T:  05/03/2011  Job:  [id number]

## 2011-05-03 NOTE — Progress Notes (Signed)
This office note has been dictated.

## 2011-05-05 ENCOUNTER — Inpatient Hospital Stay (HOSPITAL_COMMUNITY): Admission: RE | Admit: 2011-05-05 | Payer: 59 | Source: Ambulatory Visit

## 2011-05-07 ENCOUNTER — Ambulatory Visit (HOSPITAL_COMMUNITY)
Admission: RE | Admit: 2011-05-07 | Discharge: 2011-05-07 | Disposition: A | Discharge status: Home / Self Care | Payer: 59 | Source: Ambulatory Visit | Attending: Hematology and Oncology | Admitting: Hematology and Oncology

## 2011-05-07 DIAGNOSIS — D3A00 Benign carcinoid tumor of unspecified site: Secondary | ICD-10-CM

## 2011-05-07 DIAGNOSIS — D259 Leiomyoma of uterus, unspecified: Secondary | ICD-10-CM | POA: Insufficient documentation

## 2011-05-07 DIAGNOSIS — M549 Dorsalgia, unspecified: Secondary | ICD-10-CM | POA: Insufficient documentation

## 2011-05-07 DIAGNOSIS — N949 Unspecified condition associated with female genital organs and menstrual cycle: Secondary | ICD-10-CM | POA: Insufficient documentation

## 2011-05-07 DIAGNOSIS — R229 Localized swelling, mass and lump, unspecified: Secondary | ICD-10-CM | POA: Insufficient documentation

## 2011-05-07 MED ORDER — GADOBENATE DIMEGLUMINE 529 MG/ML IV SOLN
17.0000 mL | Freq: Once | INTRAVENOUS | Status: AC | PRN
  Administered 2011-05-07: 17 mL via INTRAVENOUS

## 2011-05-08 LAB — COMPREHENSIVE METABOLIC PANEL
ALT: 14 U/L (ref 0–35)
AST: 15 U/L (ref 0–37)
Albumin: 4.4 g/dL (ref 3.5–5.2)
Alkaline Phosphatase: 58 U/L (ref 39–117)
BUN: 10 mg/dL (ref 6–23)
CO2: 29 mEq/L (ref 19–32)
Calcium: 9.1 mg/dL (ref 8.4–10.5)
Chloride: 104 mEq/L (ref 96–112)
Creatinine, Ser: 0.77 mg/dL (ref 0.50–1.10)
Glucose, Bld: 86 mg/dL (ref 70–99)
Potassium: 4.3 mEq/L (ref 3.5–5.3)
Sodium: 140 mEq/L (ref 135–145)
Total Bilirubin: 0.3 mg/dL (ref 0.3–1.2)
Total Protein: 7.4 g/dL (ref 6.0–8.3)

## 2011-05-08 LAB — CHROMOGRANIN A: Chromogranin A: 21 ng/mL — ABNORMAL HIGH (ref 1.9–15.0)

## 2011-05-09 ENCOUNTER — Telehealth: Payer: Self-pay | Admitting: *Deleted

## 2011-05-09 NOTE — Telephone Encounter (Signed)
Called pt on cell phone and left message on voice mail re:  MRI results ok as per md. Pt's   Cell phone    [phone].

## 2011-10-16 ENCOUNTER — Other Ambulatory Visit: Payer: Self-pay | Admitting: Obstetrics and Gynecology

## 2011-10-16 DIAGNOSIS — Z1231 Encounter for screening mammogram for malignant neoplasm of breast: Secondary | ICD-10-CM

## 2011-10-25 ENCOUNTER — Other Ambulatory Visit: Payer: Self-pay | Admitting: *Deleted

## 2011-10-25 DIAGNOSIS — D3A8 Other benign neuroendocrine tumors: Secondary | ICD-10-CM

## 2011-10-28 ENCOUNTER — Other Ambulatory Visit: Payer: 59 | Admitting: Lab

## 2011-10-31 ENCOUNTER — Telehealth: Payer: Self-pay | Admitting: *Deleted

## 2011-10-31 NOTE — Telephone Encounter (Signed)
Pt did not come for lab appt on 10/28/11.   Spoke with pt on cell phone and was informed that pt is currently out of town.   Pt aware that md's schedule is very busy.    Instructed pt to let office know when pt is back in town so reschedule appts will be made for pt.   Pt voiced understanding.

## 2011-11-01 ENCOUNTER — Ambulatory Visit: Payer: 59 | Admitting: Hematology and Oncology

## 2011-11-12 ENCOUNTER — Telehealth: Payer: Self-pay | Admitting: Hematology and Oncology

## 2011-11-12 NOTE — Telephone Encounter (Signed)
Pt lmonvm wanting to r/s lb/fu visit. Message sent to LO for new d/t. Returned call and lm for pt that message has been sent to LO and she will be contacted w/fu appt.

## 2011-11-14 ENCOUNTER — Telehealth: Payer: Self-pay | Admitting: Hematology and Oncology

## 2011-11-14 NOTE — Telephone Encounter (Signed)
lmonvm on cell for pt and also w/husband for pt to call me back ASAP re appt w/LO. Per LO she had a cancellation on 8/6 (9am) and can see pt in that slot. Pt will have to come in prior to 8/6 for lb.

## 2011-11-15 ENCOUNTER — Ambulatory Visit: Payer: 59

## 2011-11-15 ENCOUNTER — Ambulatory Visit (HOSPITAL_BASED_OUTPATIENT_CLINIC_OR_DEPARTMENT_OTHER): Payer: 59 | Admitting: Lab

## 2011-11-15 ENCOUNTER — Telehealth: Payer: Self-pay | Admitting: Hematology and Oncology

## 2011-11-15 DIAGNOSIS — D3A8 Other benign neuroendocrine tumors: Secondary | ICD-10-CM

## 2011-11-15 DIAGNOSIS — D3A00 Benign carcinoid tumor of unspecified site: Secondary | ICD-10-CM

## 2011-11-15 LAB — CBC WITH DIFFERENTIAL/PLATELET
BASO%: 0.3 % (ref 0.0–2.0)
Basophils Absolute: 0 10*3/uL (ref 0.0–0.1)
EOS%: 1.2 % (ref 0.0–7.0)
Eosinophils Absolute: 0.1 10*3/uL (ref 0.0–0.5)
HCT: 40 % (ref 34.8–46.6)
HGB: 13.5 g/dL (ref 11.6–15.9)
LYMPH%: 30.2 % (ref 14.0–49.7)
MCH: 31.5 pg (ref 25.1–34.0)
MCHC: 33.8 g/dL (ref 31.5–36.0)
MCV: 93.1 fL (ref 79.5–101.0)
MONO#: 0.5 10*3/uL (ref 0.1–0.9)
MONO%: 8.6 % (ref 0.0–14.0)
NEUT#: 3.4 10*3/uL (ref 1.5–6.5)
NEUT%: 59.7 % (ref 38.4–76.8)
Platelets: 225 10*3/uL (ref 145–400)
RBC: 4.3 10*6/uL (ref 3.70–5.45)
RDW: 13.5 % (ref 11.2–14.5)
WBC: 5.7 10*3/uL (ref 3.9–10.3)
lymph#: 1.7 10*3/uL (ref 0.9–3.3)

## 2011-11-15 NOTE — Telephone Encounter (Signed)
Pt came in today for appts and was sent to lb today and given f/u w/LO for 8/6 @ 9 am - d/t per LO

## 2011-11-19 ENCOUNTER — Ambulatory Visit (HOSPITAL_BASED_OUTPATIENT_CLINIC_OR_DEPARTMENT_OTHER): Payer: 59 | Admitting: Hematology and Oncology

## 2011-11-19 ENCOUNTER — Telehealth: Payer: Self-pay | Admitting: *Deleted

## 2011-11-19 ENCOUNTER — Encounter: Payer: Self-pay | Admitting: Hematology and Oncology

## 2011-11-19 VITALS — BP 137/87 | HR 88 | Temp 99.1°F | Resp 20 | Ht 63.5 in | Wt 181.7 lb

## 2011-11-19 DIAGNOSIS — D3A8 Other benign neuroendocrine tumors: Secondary | ICD-10-CM | POA: Insufficient documentation

## 2011-11-19 DIAGNOSIS — C7A012 Malignant carcinoid tumor of the ileum: Secondary | ICD-10-CM

## 2011-11-19 NOTE — Progress Notes (Signed)
This office note has been dictated.

## 2011-11-19 NOTE — Patient Instructions (Addendum)
Samantha Martinez  [id number]  [facility] Discharge Instructions  RECOMMENDATIONS MADE BY THE CONSULTANT AND ANY TEST RESULTS WILL BE SENT TO YOUR REFERRING DOCTOR.   EXAM FINDINGS BY MD TODAY AND SIGNS AND SYMPTOMS TO REPORT TO CLINIC OR PRIMARY MD:   Your current list of medications are: Current Outpatient Prescriptions  Medication Sig Dispense Refill  . Multiple Vitamins-Minerals (MULTIVITAMIN PO) Take 1 tablet by mouth daily.      Marland Kitchen zolpidem (AMBIEN CR) 12.5 MG CR tablet Take 12.5 mg by mouth at bedtime as needed.      . ALPRAZolam (XANAX) 0.5 MG tablet Take 0.5 mg by mouth as needed.         INSTRUCTIONS GIVEN AND DISCUSSED:   SPECIAL INSTRUCTIONS/FOLLOW-UP:  See above.  I acknowledge that I have been informed and understand all the instructions given to me and received a copy. I do not have any more questions at this time, but understand that I may call the [facility] at [phone] during business hours should I have any further questions or need assistance in obtaining follow-up care.

## 2011-11-19 NOTE — Progress Notes (Signed)
CC:   Samantha Martinez. Polite, M.D. Cherylynn Ridges, M.D. Jordan Hawks Elnoria Howard, MD  IDENTIFYING STATEMENT:  The patient is a 61 year woman with a neuroendocrine tumor, who presents for followup.  INTERVAL HISTORY:  Samantha Martinez was seen 6 months ago.  She has no pain.  Has no nausea or vomiting.  She is prone to constipation but is moving her bowels with laxatives and fiber.  Her weight is stable.  MEDICATIONS:  Reviewed and updated.  ALLERGIES:  None.  PHYSICAL EXAMINATION:  The patient is a well-appearing, well-nourished woman in no distress.  Vitals:  Pulse 88, blood pressure 137/87, temperature 99.1, respirations 20, weight 181.7 pounds.  HEENT:  Head is atraumatic, normocephalic.  Sclerae anicteric.  Mouth moist.  Chest: Clear.  CVS:  Unremarkable.  Abdomen:  Soft, nontender.  Bowel sounds present.  Extremities:  No edema.  LABORATORY DATA:  11/15/2011:  White cell count 5.7, hemoglobin 13.5, hematocrit 40, platelets 225.  Sodium 140, potassium 4.3, chloride 103, CO2 28, BUN 10, creatinine 0.76, glucose 82, T-bili 0.4, alkaline phosphatase 59, AST 17, ALT 18, calcium 9.3.  IMPRESSION AND PLAN:  Samantha Martinez is a 61 year old woman with history of a low to intermediate-grade neuroendocrine tumor of the ileum diagnosed on January 12, 2008.  She is status post resection of the ileum and cecum with primary anastomosis.  Her tumor was 5 cm in size with evidence of lymphovascular invasion.  One of the 6 nodes was positive for tumor.  Samantha Martinez current exam and blood work, although we are still awaiting a chromogranin level, are unremarkable. We will be calling her with results and if within normal limits, she is scheduled to follow up routinely in 6 months' time.  At that time we will obtain lab work to include a 5HIAA and a CT scan of the abdomen and pelvis.    ______________________________ Laurice Record, M.D. LIO/MEDQ  D:  11/19/2011  T:  11/19/2011  Job:  [id number]

## 2011-11-19 NOTE — Telephone Encounter (Signed)
Gave patient appointment for 04-27-2012 lab and scans gave patient instructions on drinking the contrast gave patient appointment for 05-29-2012 at 11:00am printed out calendar and gave to the patient

## 2011-11-20 LAB — COMPREHENSIVE METABOLIC PANEL
ALT: 18 U/L (ref 0–35)
AST: 17 U/L (ref 0–37)
Albumin: 4.3 g/dL (ref 3.5–5.2)
Alkaline Phosphatase: 59 U/L (ref 39–117)
BUN: 10 mg/dL (ref 6–23)
CO2: 28 mEq/L (ref 19–32)
Calcium: 9.3 mg/dL (ref 8.4–10.5)
Chloride: 103 mEq/L (ref 96–112)
Creatinine, Ser: 0.76 mg/dL (ref 0.50–1.10)
Glucose, Bld: 82 mg/dL (ref 70–99)
Potassium: 4.3 mEq/L (ref 3.5–5.3)
Sodium: 140 mEq/L (ref 135–145)
Total Bilirubin: 0.4 mg/dL (ref 0.3–1.2)
Total Protein: 7.3 g/dL (ref 6.0–8.3)

## 2011-11-20 LAB — CHROMOGRANIN A: Chromogranin A: 6.8 ng/mL (ref 1.9–15.0)

## 2011-11-29 ENCOUNTER — Ambulatory Visit
Admission: RE | Admit: 2011-11-29 | Discharge: 2011-11-29 | Disposition: A | Discharge status: Home / Self Care | Payer: 59 | Source: Ambulatory Visit | Attending: Obstetrics and Gynecology | Admitting: Obstetrics and Gynecology

## 2011-11-29 DIAGNOSIS — Z1231 Encounter for screening mammogram for malignant neoplasm of breast: Secondary | ICD-10-CM

## 2012-04-18 ENCOUNTER — Telehealth: Payer: Self-pay | Admitting: Oncology

## 2012-04-18 NOTE — Telephone Encounter (Signed)
S/W pt in re to provider change. Pt requested Dr. Gaylyn Rong @ 9 on 2/14. Letter/calendar mailed.

## 2012-04-20 ENCOUNTER — Encounter: Payer: Self-pay | Admitting: Internal Medicine

## 2012-05-15 ENCOUNTER — Telehealth: Payer: Self-pay | Admitting: Oncology

## 2012-05-15 ENCOUNTER — Other Ambulatory Visit: Payer: Self-pay | Admitting: *Deleted

## 2012-05-15 ENCOUNTER — Ambulatory Visit (HOSPITAL_COMMUNITY)
Admission: RE | Admit: 2012-05-15 | Discharge: 2012-05-15 | Disposition: A | Discharge status: Home / Self Care | Payer: BC Managed Care – PPO | Source: Ambulatory Visit | Attending: Hematology and Oncology | Admitting: Hematology and Oncology

## 2012-05-15 ENCOUNTER — Other Ambulatory Visit (HOSPITAL_BASED_OUTPATIENT_CLINIC_OR_DEPARTMENT_OTHER): Payer: BC Managed Care – PPO | Admitting: Lab

## 2012-05-15 ENCOUNTER — Encounter (HOSPITAL_COMMUNITY): Payer: Self-pay

## 2012-05-15 DIAGNOSIS — Z09 Encounter for follow-up examination after completed treatment for conditions other than malignant neoplasm: Secondary | ICD-10-CM | POA: Insufficient documentation

## 2012-05-15 DIAGNOSIS — D3A8 Other benign neuroendocrine tumors: Secondary | ICD-10-CM

## 2012-05-15 DIAGNOSIS — D3A00 Benign carcinoid tumor of unspecified site: Secondary | ICD-10-CM

## 2012-05-15 DIAGNOSIS — D259 Leiomyoma of uterus, unspecified: Secondary | ICD-10-CM | POA: Insufficient documentation

## 2012-05-15 LAB — CBC WITH DIFFERENTIAL/PLATELET
BASO%: 0.2 % (ref 0.0–2.0)
Basophils Absolute: 0 10*3/uL (ref 0.0–0.1)
EOS%: 1.1 % (ref 0.0–7.0)
Eosinophils Absolute: 0.1 10*3/uL (ref 0.0–0.5)
HCT: 40.6 % (ref 34.8–46.6)
HGB: 13.6 g/dL (ref 11.6–15.9)
LYMPH%: 24.9 % (ref 14.0–49.7)
MCH: 30.6 pg (ref 25.1–34.0)
MCHC: 33.4 g/dL (ref 31.5–36.0)
MCV: 91.5 fL (ref 79.5–101.0)
MONO#: 0.7 10*3/uL (ref 0.1–0.9)
MONO%: 8.9 % (ref 0.0–14.0)
NEUT#: 4.8 10*3/uL (ref 1.5–6.5)
NEUT%: 64.9 % (ref 38.4–76.8)
Platelets: 243 10*3/uL (ref 145–400)
RBC: 4.44 10*6/uL (ref 3.70–5.45)
RDW: 13.6 % (ref 11.2–14.5)
WBC: 7.5 10*3/uL (ref 3.9–10.3)
lymph#: 1.9 10*3/uL (ref 0.9–3.3)

## 2012-05-15 LAB — COMPREHENSIVE METABOLIC PANEL (CC13)
ALT: 20 U/L (ref 0–55)
AST: 19 U/L (ref 5–34)
Albumin: 3.7 g/dL (ref 3.5–5.0)
Alkaline Phosphatase: 81 U/L (ref 40–150)
BUN: 9.4 mg/dL (ref 7.0–26.0)
CO2: 26 mEq/L (ref 22–29)
Calcium: 9.4 mg/dL (ref 8.4–10.4)
Chloride: 103 mEq/L (ref 98–107)
Creatinine: 0.8 mg/dL (ref 0.6–1.1)
Glucose: 84 mg/dl (ref 70–99)
Potassium: 3.8 mEq/L (ref 3.5–5.1)
Sodium: 140 mEq/L (ref 136–145)
Total Bilirubin: 0.49 mg/dL (ref 0.20–1.20)
Total Protein: 8.3 g/dL (ref 6.4–8.3)

## 2012-05-15 MED ORDER — IOHEXOL 300 MG/ML  SOLN
100.0000 mL | Freq: Once | INTRAMUSCULAR | Status: AC | PRN
  Administered 2012-05-15: 100 mL via INTRAVENOUS

## 2012-05-15 NOTE — Telephone Encounter (Signed)
Pt going out of town and needed r/s appt.Marland KitchenMarland KitchenDone

## 2012-05-20 LAB — CHROMOGRANIN A: Chromogranin A: 11.4 ng/mL (ref 1.9–15.0)

## 2012-05-25 ENCOUNTER — Ambulatory Visit: Payer: BC Managed Care – PPO | Admitting: Lab

## 2012-05-27 ENCOUNTER — Ambulatory Visit: Payer: Self-pay | Admitting: Oncology

## 2012-05-27 ENCOUNTER — Telehealth: Payer: Self-pay | Admitting: *Deleted

## 2012-05-27 NOTE — Telephone Encounter (Signed)
Called pt to see if she is coming to her appt this afternoon.  Pt states she did call and left message for scheduler to r/s her appt due to bad weather this afternoon.   Instructed pt to expect call from scheduler.

## 2012-05-27 NOTE — Progress Notes (Signed)
Rescheduled due to weather 

## 2012-05-28 ENCOUNTER — Telehealth: Payer: Self-pay | Admitting: Oncology

## 2012-05-28 NOTE — Telephone Encounter (Signed)
s.w. pt and advised on 2.27.14 appt...pt ok and aware

## 2012-05-29 ENCOUNTER — Ambulatory Visit: Payer: 59 | Admitting: Oncology

## 2012-05-29 ENCOUNTER — Ambulatory Visit: Payer: 59 | Admitting: Hematology and Oncology

## 2012-05-30 LAB — 5 HIAA, QUANTITATIVE, URINE, 24 HOUR
5-HIAA, 24 Hr Urine: 4.4 mg/24 h (ref <=6.0)
Volume, Urine-5HIAA: 1300 mL/24 h

## 2012-06-11 ENCOUNTER — Ambulatory Visit (HOSPITAL_BASED_OUTPATIENT_CLINIC_OR_DEPARTMENT_OTHER): Payer: BC Managed Care – PPO | Admitting: Oncology

## 2012-06-11 ENCOUNTER — Telehealth: Payer: Self-pay | Admitting: Oncology

## 2012-06-11 VITALS — BP 135/75 | HR 91 | Temp 97.5°F | Resp 20 | Ht 63.5 in | Wt 179.2 lb

## 2012-06-11 DIAGNOSIS — C7A012 Malignant carcinoid tumor of the ileum: Secondary | ICD-10-CM

## 2012-06-11 DIAGNOSIS — R1900 Intra-abdominal and pelvic swelling, mass and lump, unspecified site: Secondary | ICD-10-CM

## 2012-06-11 DIAGNOSIS — D3A8 Other benign neuroendocrine tumors: Secondary | ICD-10-CM

## 2012-06-11 NOTE — Patient Instructions (Addendum)
1.  History of neuroendocrine tumor.  2.  Status:  No evidence of disease. 3.  Follow up:  In about 1 year.

## 2012-06-11 NOTE — Progress Notes (Signed)
[facility]  Telephone:([phone]) [phone] [phone]) [phone]   OFFICE PROGRESS NOTE   Cc:  No primary provider on file.  DIAGNOSIS AND PAST THERAPY:  : neuroendocrine tumor, low-intermediate grade, ileum origin; pT4, pN1 M0; s/p resection in 12/2007 with positive neurovascular invasion.   CURRENT THERAPY:  Watchful observation.   INTERVAL HISTORY: Samantha Martinez 62 y.o. female returns for regular follow up by herself.  She was under the care of Dr. Dalene Carrow who has since left the practice.  I have assumed her care today.  She reported for the past month a left posterior lower back near iliac crest palpable mass about 3cm.  She never noticed it before.  It has been painful when she lies on it.  It is crampy; worse in the morning when she is waking up.  She cannot sleep flat on her back due to this painful mass.  She denied palpable mass elsewhere.  She denied jaundice, fever, fatigue, SOB, chest pain, flush, cough, abdominal pain, diarrhea, bleeding symptoms.  The rest of the 14-point review of system was negative.   Past Medical History  Diagnosis Date  . Goiter   . Malignant carcinoid tumor of the ileum     Past Surgical History  Procedure Laterality Date  . Appendectomy    . Thyroidectomy    . Ovarian cyst removal    . Colposcopy      Current Outpatient Prescriptions  Medication Sig Dispense Refill  . Multiple Vitamins-Minerals (MULTIVITAMIN PO) Take 1 tablet by mouth daily.      Marland Kitchen ALPRAZolam (XANAX) 0.5 MG tablet Take 0.5 mg by mouth as needed.      . zolpidem (AMBIEN CR) 12.5 MG CR tablet Take 12.5 mg by mouth at bedtime as needed.       No current facility-administered medications for this visit.    ALLERGIES:  has No Known Allergies.  REVIEW OF SYSTEMS:  The rest of the 14-point review of system was negative.   Filed Vitals:   06/11/12 1522  BP: 135/75  Pulse: 91  Temp: 97.5 F (36.4 C)  Resp: 20   Wt Readings from Last 3 Encounters:    06/11/12 179 lb 3.2 oz (81.285 kg)  11/19/11 181 lb 11.2 oz (82.419 kg)  05/03/11 179 lb (81.194 kg)   ECOG Performance status: 0  PHYSICAL EXAMINATION:    General:  well-nourished woman, in no acute distress.  Eyes:  no scleral icterus.  ENT:  There were no oropharyngeal lesions.  Neck was without thyromegaly.  Lymphatics:  Negative cervical, supraclavicular or axillary adenopathy.  Respiratory: lungs were clear bilaterally without wheezing or crackles.  Cardiovascular:  Regular rate and rhythm, S1/S2, without murmur, rub or gallop.  There was no pedal edema.  GI:  abdomen was soft, flat, nontender, nondistended, without organomegaly.  Muscoloskeletal:  no spinal tenderness of palpation of vertebral spine.  I could palpate a mobile mass about 3x2cm around the left superior posterior iliac crest.  It was painful on palpable.  There was no overlying skin erythema, purulent discharge or open wound.  Skin exam was without echymosis, petichae.  Neuro exam was nonfocal.  Patient was able to get on and off exam table without assistance.  Gait was normal.  Patient was alerted and oriented.  Attention was good.   Language was appropriate.  Mood was normal without depression.  Speech was not pressured.  Thought content was not tangential.         LABORATORY/RADIOLOGY DATA:  Lab Results  Component Value Date   WBC 7.5 05/15/2012   HGB 13.6 05/15/2012   HCT 40.6 05/15/2012   PLT 243 05/15/2012   GLUCOSE 84 05/15/2012   ALKPHOS 81 05/15/2012   ALT 20 05/15/2012   AST 19 05/15/2012   NA 140 05/15/2012   K 3.8 05/15/2012   CL 103 05/15/2012   CREATININE 0.8 05/15/2012   BUN 9.4 05/15/2012   CO2 26 05/15/2012   IMAGING:  I personally reviewed the following CT scan and showed the patient the images.  I could not identify any mass in the pelvis around the area of concerned on exam.   Ct Abdomen Pelvis W Contrast  05/15/2012  *RADIOLOGY REPORT*  Clinical Data: Follow-up neuroendocrine tumor.  CT ABDOMEN AND  PELVIS WITH CONTRAST  Technique:  Multidetector CT imaging of the abdomen and pelvis was performed following the standard protocol during bolus administration of intravenous contrast.  Contrast: OMNIPAQUE IOHEXOL 300 MG/ML  SOLN  Comparison: Multiple prior CT scans.  Findings: The lung bases are clear.  No pulmonary nodules or pleural effusion.  The heart is normal in size.  No pericardial effusion.  The distal esophagus is unremarkable.  The liver is normal.  No focal hepatic lesions or intrahepatic biliary dilatation.  The portal and hepatic veins are patent.  The gallbladder is normal.  No common bile duct dilatation.  The pancreas is normal.  The spleen is normal.  The adrenal glands and kidneys are normal.  The stomach, duodenum, small bowel and colon are unremarkable. Stable surgical changes involving the colon and terminal ileum.  No findings to suggest recurrent tumor. Mild under distention of the cecal tip.  No mesenteric or retroperitoneal lymphadenopathy.  The aorta is normal in caliber.  The major branch vessels are patent.  No atherosclerotic changes.  The uterus is stable.  Calcified fibroids are noted.  The ovaries are normal.  No pelvic mass, adenopathy or free pelvic fluid collections.  No inguinal mass or hernia.  Stable urachal remnant. The bony thorax is intact.  IMPRESSION: Unremarkable and stable CT appearance of the abdomen/pelvis.  No findings to suggest recurrent tumor or metastatic disease.   Original Report Authenticated By: Rudie Meyer, M.D.    ASSESSMENT AND PLAN:   1.  History of neuroendocrine tumor.  Serum chromagranin and 24-hour urine for HIAA were within normal range.  CT abdomen/pelvis was without sign of local or distant met.  I recommended watchful observation with yearly lab and CT.   2.  Right posterior superior iliac crest mass: not visualized on CT in 04/2012.  She is rather symptomatic.  I referred her to General Surgery for evaluation.   3.  Follow up:  In  about 1 year but sooner if concerning issues.     The length of time of the face-to-face encounter was 25 minutes. More than 50% of time was spent counseling and coordination of care.

## 2012-06-11 NOTE — Telephone Encounter (Signed)
Gave pt appt for lab and MD for 2015, referred to Dr.Cornett for lower back mass, gave pt oral contrast for CT

## 2012-06-16 ENCOUNTER — Ambulatory Visit (INDEPENDENT_AMBULATORY_CARE_PROVIDER_SITE_OTHER): Payer: Self-pay | Admitting: Surgery

## 2012-06-30 ENCOUNTER — Encounter (INDEPENDENT_AMBULATORY_CARE_PROVIDER_SITE_OTHER): Payer: Self-pay | Admitting: General Surgery

## 2012-06-30 ENCOUNTER — Ambulatory Visit (INDEPENDENT_AMBULATORY_CARE_PROVIDER_SITE_OTHER): Payer: 59 | Admitting: General Surgery

## 2012-06-30 ENCOUNTER — Telehealth (INDEPENDENT_AMBULATORY_CARE_PROVIDER_SITE_OTHER): Payer: Self-pay | Admitting: General Surgery

## 2012-06-30 VITALS — BP 124/72 | HR 78 | Temp 99.9°F | Resp 18 | Ht 64.0 in | Wt 178.0 lb

## 2012-06-30 DIAGNOSIS — R222 Localized swelling, mass and lump, trunk: Secondary | ICD-10-CM | POA: Insufficient documentation

## 2012-06-30 DIAGNOSIS — R229 Localized swelling, mass and lump, unspecified: Secondary | ICD-10-CM

## 2012-06-30 NOTE — Telephone Encounter (Signed)
Spoke with patient she is aware of  appt 07/04/12 @4 :15 pm

## 2012-06-30 NOTE — Progress Notes (Signed)
The patient is well known to me for prior history of a neuroendocrine tumor. She currently has complaints of right lower back pain just above the iliac crest on the right side and just lateral to the lumbar spine. On examination she has a soft rubbery type soft mass with an approximate measurement of 2 x 3 cm in size that is deep in the soft tissue. Upon palpating the mass she has some shooting pain all ports were right lateral flank. The patient describes his masses given her screw shaving right lower back pain that shoots up towards her right side and is aggravated by sitting for prolonged periods of time.  I do have some concerns that this could represent some type of neurologic problem involving the nerves from her lower lobar spine. Because of this I do think an MRI scan of her back would be helpful. Once we get the results from this we will go ahead and see her back in clinic in order to decide if I will be doing surgery or if she needs referral to a neurosurgeon or a back surgeon.

## 2012-07-03 ENCOUNTER — Ambulatory Visit
Admission: RE | Admit: 2012-07-03 | Discharge: 2012-07-03 | Disposition: A | Discharge status: Home / Self Care | Payer: 59 | Source: Ambulatory Visit | Attending: General Surgery | Admitting: General Surgery

## 2012-07-03 DIAGNOSIS — R222 Localized swelling, mass and lump, trunk: Secondary | ICD-10-CM

## 2012-07-03 MED ORDER — GADOBENATE DIMEGLUMINE 529 MG/ML IV SOLN
16.0000 mL | Freq: Once | INTRAVENOUS | Status: AC | PRN
  Administered 2012-07-03: 16 mL via INTRAVENOUS

## 2012-07-04 ENCOUNTER — Other Ambulatory Visit: Payer: BC Managed Care – PPO

## 2012-07-05 ENCOUNTER — Other Ambulatory Visit: Payer: BC Managed Care – PPO

## 2012-07-16 ENCOUNTER — Encounter (INDEPENDENT_AMBULATORY_CARE_PROVIDER_SITE_OTHER): Payer: Self-pay | Admitting: General Surgery

## 2012-07-16 ENCOUNTER — Ambulatory Visit (INDEPENDENT_AMBULATORY_CARE_PROVIDER_SITE_OTHER): Payer: 59 | Admitting: General Surgery

## 2012-07-16 VITALS — BP 132/80 | HR 73 | Temp 98.2°F | Resp 18 | Ht 64.0 in | Wt 179.0 lb

## 2012-07-16 DIAGNOSIS — R229 Localized swelling, mass and lump, unspecified: Secondary | ICD-10-CM

## 2012-07-16 DIAGNOSIS — M549 Dorsalgia, unspecified: Secondary | ICD-10-CM

## 2012-07-16 DIAGNOSIS — R222 Localized swelling, mass and lump, trunk: Secondary | ICD-10-CM

## 2012-07-16 NOTE — Progress Notes (Signed)
The patient comes in today still complaining of significant disability from the right lower back pain. On examination I can palpate a mobile rubbery mass in the deep portion of her lower back. When you press on the stretcher he does seem to exacerbate her pain. Does not cause any shooting pain to her lower extremities however.her MRI scan shows the following: Negative for fracture or mass lesion.  Multilevel lumbar disc degeneration and spondylosis, most prominent  at L4-5 where there is mild spinal stenosis  Allowed to get a second opinion about her lower back discomfort and lower extremity symptoms from a back surgeon. A like to refer her to Dr. Venita Lick.if Dr. Shon Baton findings of his workup that the patient has significant spinal disease to warrant surgery then perhaps this small bowel mass right lower portion of her spine could be treated at the same time if it is a sober lipoma or subcutaneous fascial growth. I do not believe as though this is a neuroma however that is a possibility. She does have some distal disease and some stenosis that could be addressed. I do remove the mass from the patient's right lower back but I'm uncertain as to whether or not this would rectify the patient's current symptoms. I would like to see the patient back in approximately 6-8 weeks after she's been seen by the back specialist.

## 2012-07-17 ENCOUNTER — Telehealth (INDEPENDENT_AMBULATORY_CARE_PROVIDER_SITE_OTHER): Payer: Self-pay | Admitting: General Surgery

## 2012-07-17 NOTE — Telephone Encounter (Signed)
I spoke with Pam at DR Sheela Stack office she did not give me an appt date they are looking at notes and insurance that I faxed to [phone] before giving appt I requested they contact myself as well as patient with date time or if they decline to see

## 2012-07-20 ENCOUNTER — Telehealth (INDEPENDENT_AMBULATORY_CARE_PROVIDER_SITE_OTHER): Payer: Self-pay

## 2012-07-20 ENCOUNTER — Telehealth (INDEPENDENT_AMBULATORY_CARE_PROVIDER_SITE_OTHER): Payer: Self-pay | Admitting: General Surgery

## 2012-07-20 NOTE — Telephone Encounter (Signed)
Samantha Martinez has been in contact with office regarding referral. She will call and get update and contact patient.

## 2012-07-20 NOTE — Telephone Encounter (Signed)
Pt calling in asking about her referral to the Orthopedic doctor b/c her back pain is getting worse. The pt was not able to go to work today b/c the pain. Pls call her ASAP about the referral appt.

## 2012-07-20 NOTE — Telephone Encounter (Signed)
Spoke with patient she is still in lot of pain . Dr Shon Baton has still not reviewed this patient records that was faxed on 07/17/12 11:07 so no appt has been given for pain management yet. Patient is aware and states she may  Go to her  Ortho Dr .

## 2012-09-04 ENCOUNTER — Other Ambulatory Visit: Payer: Self-pay | Admitting: Internal Medicine

## 2012-09-04 ENCOUNTER — Other Ambulatory Visit: Payer: 59

## 2012-09-04 DIAGNOSIS — M79661 Pain in right lower leg: Secondary | ICD-10-CM

## 2012-09-10 ENCOUNTER — Inpatient Hospital Stay: Admission: RE | Admit: 2012-09-10 | Payer: 59 | Source: Ambulatory Visit

## 2012-11-12 ENCOUNTER — Other Ambulatory Visit: Payer: Self-pay

## 2012-11-12 DIAGNOSIS — Z1231 Encounter for screening mammogram for malignant neoplasm of breast: Secondary | ICD-10-CM

## 2012-11-27 ENCOUNTER — Ambulatory Visit: Payer: 59

## 2012-12-11 ENCOUNTER — Ambulatory Visit
Admission: RE | Admit: 2012-12-11 | Discharge: 2012-12-11 | Disposition: A | Discharge status: Home / Self Care | Payer: 59 | Source: Ambulatory Visit

## 2012-12-11 DIAGNOSIS — Z1231 Encounter for screening mammogram for malignant neoplasm of breast: Secondary | ICD-10-CM

## 2013-02-18 ENCOUNTER — Other Ambulatory Visit: Payer: Self-pay

## 2013-05-21 ENCOUNTER — Other Ambulatory Visit: Payer: Self-pay | Admitting: Hematology and Oncology

## 2013-05-21 DIAGNOSIS — D3A8 Other benign neuroendocrine tumors: Secondary | ICD-10-CM

## 2013-05-28 ENCOUNTER — Telehealth: Payer: Self-pay | Admitting: Hematology and Oncology

## 2013-05-28 NOTE — Telephone Encounter (Signed)
s/w pt yesterday confirmed in appts for 2/25 lb/ct and 2/26 NG. former [facility] pt - appts moved from [facility] 2/27 to NG 2/26.

## 2013-06-03 ENCOUNTER — Telehealth: Payer: Self-pay | Admitting: Hematology and Oncology

## 2013-06-03 NOTE — Telephone Encounter (Signed)
pt came in to r/s appt to 1week after ct

## 2013-06-04 ENCOUNTER — Ambulatory Visit (INDEPENDENT_AMBULATORY_CARE_PROVIDER_SITE_OTHER): Payer: 59

## 2013-06-04 ENCOUNTER — Encounter: Payer: Self-pay | Admitting: Podiatrist

## 2013-06-04 ENCOUNTER — Ambulatory Visit: Payer: 59 | Admitting: Podiatrist

## 2013-06-04 VITALS — BP 129/83 | HR 79 | Resp 12

## 2013-06-04 DIAGNOSIS — R52 Pain, unspecified: Secondary | ICD-10-CM

## 2013-06-04 DIAGNOSIS — M7671 Peroneal tendinitis, right leg: Secondary | ICD-10-CM

## 2013-06-04 DIAGNOSIS — Q6650 Congenital pes planus, unspecified foot: Secondary | ICD-10-CM

## 2013-06-04 DIAGNOSIS — M7750 Other enthesopathy of unspecified foot: Secondary | ICD-10-CM

## 2013-06-04 MED ORDER — DICLOFENAC SODIUM 1 % TD GEL: 2.0000 g | Freq: Four times a day (QID) | TRANSDERMAL | Status: DC

## 2013-06-04 NOTE — Progress Notes (Deleted)
Dress orthotic-- flat feet, pain lateral left ankle.. ? Peroneal tendon rupture?  Injected area a year ago

## 2013-06-04 NOTE — Patient Instructions (Signed)
We are ordering your custom orthotic devices which are specifically made for you!  We will  notifiy you when these are ready for pick up-

## 2013-06-04 NOTE — Progress Notes (Signed)
   Subjective:    Patient ID: Samantha Martinez, female    DOB: [birth year], 63 y.o.   MRN: [id number]  HPI PT STATED HAVE FLAT FOOTED. RT FOOT BACK OF THE HEEL WHEN BENDING IS HURTING AND LT FOOT ARCH IS HAVING  SHARP PAIN FOR ABOUT 10 YEARS. TREAT MENT TRIED CORTISONE SHOT BUT DID NOT HELP.    Review of Systems  Musculoskeletal: Positive for back pain and joint swelling.  All other systems reviewed and are negative.       Objective:   Physical Exam GENERAL APPEARANCE: Alert, conversant. Appropriately groomed. No acute distress.  VASCULAR: Pedal pulses palpable at 2/4 DP and PT bilateral.  Capillary refill time is immediate to all digits,  Proximal to distal cooling it warm to warm.  Digital hair growth is present bilateral  NEUROLOGIC: sensation is intact epicritically and protectively to 5.07 monofilament at 5/5 sites bilateral.  Light touch is intact bilateral, vibratory sensation intact bilateral, achilles tendon reflex is intact bilateral.  MUSCULOSKELETAL: Pes planovalgus changes are noted bilateral. Along the posterior tibial tendon is also present bilateral. Swelling along the lateral aspect of the left ankle is noted concerning for peroneal tendinitis or rupture. She had the area injected a year ago and states that she's had pain and discomfort for a while now.   DERMATOLOGIC: skin color, texture, and turger are within normal limits.      Assessment & Plan:  pesplanovalgus deformity bilateral, peroneal tendonitis left Plan: Recommended a orthotic and she was scanned at today's visit. We'll get her dress orthotic to fit in most of her shoes. She'll be notified when these are ready for pick up. Discussed an MRI to determine if the peroneal tendon is intact. If she would like to look into this further she'll let me know we can order the study.

## 2013-06-07 ENCOUNTER — Other Ambulatory Visit: Payer: Self-pay | Admitting: Hematology and Oncology

## 2013-06-07 DIAGNOSIS — D3A8 Other benign neuroendocrine tumors: Secondary | ICD-10-CM

## 2013-06-09 ENCOUNTER — Other Ambulatory Visit (HOSPITAL_BASED_OUTPATIENT_CLINIC_OR_DEPARTMENT_OTHER): Payer: BC Managed Care – PPO

## 2013-06-09 ENCOUNTER — Ambulatory Visit (HOSPITAL_COMMUNITY)
Admission: RE | Admit: 2013-06-09 | Discharge: 2013-06-09 | Disposition: A | Discharge status: Home / Self Care | Payer: 59 | Source: Ambulatory Visit | Attending: Oncology | Admitting: Oncology

## 2013-06-09 DIAGNOSIS — C172 Malignant neoplasm of ileum: Secondary | ICD-10-CM | POA: Insufficient documentation

## 2013-06-09 DIAGNOSIS — D259 Leiomyoma of uterus, unspecified: Secondary | ICD-10-CM | POA: Insufficient documentation

## 2013-06-09 DIAGNOSIS — D3A8 Other benign neuroendocrine tumors: Secondary | ICD-10-CM

## 2013-06-09 DIAGNOSIS — C7A012 Malignant carcinoid tumor of the ileum: Secondary | ICD-10-CM

## 2013-06-09 LAB — CBC WITH DIFFERENTIAL/PLATELET
BASO%: 0.4 % (ref 0.0–2.0)
Basophils Absolute: 0 10*3/uL (ref 0.0–0.1)
EOS%: 0.9 % (ref 0.0–7.0)
Eosinophils Absolute: 0.1 10*3/uL (ref 0.0–0.5)
HCT: 42.3 % (ref 34.8–46.6)
HGB: 13.8 g/dL (ref 11.6–15.9)
LYMPH%: 32.4 % (ref 14.0–49.7)
MCH: 30.5 pg (ref 25.1–34.0)
MCHC: 32.6 g/dL (ref 31.5–36.0)
MCV: 93.8 fL (ref 79.5–101.0)
MONO#: 0.7 10*3/uL (ref 0.1–0.9)
MONO%: 9.4 % (ref 0.0–14.0)
NEUT#: 4.3 10*3/uL (ref 1.5–6.5)
NEUT%: 56.9 % (ref 38.4–76.8)
Platelets: 250 10*3/uL (ref 145–400)
RBC: 4.51 10*6/uL (ref 3.70–5.45)
RDW: 13.6 % (ref 11.2–14.5)
WBC: 7.6 10*3/uL (ref 3.9–10.3)
lymph#: 2.5 10*3/uL (ref 0.9–3.3)

## 2013-06-09 LAB — COMPREHENSIVE METABOLIC PANEL (CC13)
ALT: 20 U/L (ref 0–55)
AST: 20 U/L (ref 5–34)
Albumin: 4.1 g/dL (ref 3.5–5.0)
Alkaline Phosphatase: 69 U/L (ref 40–150)
Anion Gap: 9 mEq/L (ref 3–11)
BUN: 12.2 mg/dL (ref 7.0–26.0)
CO2: 28 mEq/L (ref 22–29)
Calcium: 9.5 mg/dL (ref 8.4–10.4)
Chloride: 104 mEq/L (ref 98–109)
Creatinine: 0.8 mg/dL (ref 0.6–1.1)
Glucose: 89 mg/dl (ref 70–140)
Potassium: 3.9 mEq/L (ref 3.5–5.1)
Sodium: 142 mEq/L (ref 136–145)
Total Bilirubin: 0.31 mg/dL (ref 0.20–1.20)
Total Protein: 8.3 g/dL (ref 6.4–8.3)

## 2013-06-09 MED ORDER — IOHEXOL 300 MG/ML  SOLN
100.0000 mL | Freq: Once | INTRAMUSCULAR | Status: AC | PRN
  Administered 2013-06-09: 100 mL via INTRAVENOUS

## 2013-06-10 ENCOUNTER — Ambulatory Visit: Payer: BC Managed Care – PPO | Admitting: Hematology and Oncology

## 2013-06-11 ENCOUNTER — Ambulatory Visit: Payer: BC Managed Care – PPO | Admitting: Oncology

## 2013-06-14 ENCOUNTER — Telehealth: Payer: Self-pay | Admitting: *Deleted

## 2013-06-14 NOTE — Telephone Encounter (Signed)
Pt states missed a call from the [facility].  She missed her appt last week w/ Dr. Alvy Bimler due to snow.  Informed her it may have been from Scheduling.  She has appt to see Dr. Alvy Bimler on Wed 3/04 at 3 pm.  Pt verbalized understanding and says she plans ok keeping appt.

## 2013-06-16 ENCOUNTER — Encounter: Payer: Self-pay | Admitting: Hematology and Oncology

## 2013-06-16 ENCOUNTER — Ambulatory Visit (HOSPITAL_BASED_OUTPATIENT_CLINIC_OR_DEPARTMENT_OTHER): Payer: 59 | Admitting: Hematology and Oncology

## 2013-06-16 ENCOUNTER — Telehealth: Payer: Self-pay | Admitting: Hematology and Oncology

## 2013-06-16 VITALS — BP 132/80 | HR 98 | Temp 98.3°F | Resp 18 | Ht 64.0 in | Wt 182.2 lb

## 2013-06-16 DIAGNOSIS — D3A8 Other benign neuroendocrine tumors: Secondary | ICD-10-CM

## 2013-06-16 DIAGNOSIS — C7A1 Malignant poorly differentiated neuroendocrine tumors: Secondary | ICD-10-CM | POA: Diagnosis not present

## 2013-06-16 NOTE — Telephone Encounter (Signed)
gv adn printed appt sched and avs for pt for March 2016 °

## 2013-06-16 NOTE — Progress Notes (Signed)
[facility] OFFICE PROGRESS NOTE  Patient Care Team: Kandice Hams, MD as PCP - General (Internal Medicine)  DIAGNOSIS: Neuroendocrine tumor, low grade, ilium in origin status post resection in September 2009, on observation  SUMMARY OF ONCOLOGIC HISTORY: [name] was discovered to have neuroendocrine tumor after colonoscopy. The patient was not symptomatic prior to diagnosis. She underwent surgery followed by observation.  INTERVAL HISTORY: Samantha Martinez 63 y.o. female returns for further followup. She denies any pain in her abdomen. Appetite is stable no recent weight loss. No change in bowel habits. She could not remember her last colonoscopy.  I have reviewed the past medical history, past surgical history, social history and family history with the patient and they are unchanged from previous note.  ALLERGIES:  is allergic to latex.  MEDICATIONS:  Current Outpatient Prescriptions  Medication Sig Dispense Refill  . ALPRAZolam (XANAX) 0.5 MG tablet Take 0.5 mg by mouth as needed.      . Multiple Vitamins-Minerals (MULTIVITAMIN PO) Take 1 tablet by mouth daily.      Marland Kitchen zolpidem (AMBIEN CR) 12.5 MG CR tablet Take 12.5 mg by mouth at bedtime as needed.       No current facility-administered medications for this visit.    REVIEW OF SYSTEMS:   Constitutional: Denies fevers, chills or abnormal weight loss Eyes: Denies blurriness of vision Ears, nose, mouth, throat, and face: Denies mucositis or sore throat Respiratory: Denies cough, dyspnea or wheezes Cardiovascular: Denies palpitation, chest discomfort or lower extremity swelling Gastrointestinal:  Denies nausea, heartburn or change in bowel habits Skin: Denies abnormal skin rashes Lymphatics: Denies new lymphadenopathy or easy bruising Neurological:Denies numbness, tingling or new weaknesses Behavioral/Psych: Mood is stable, no new changes  All other systems were reviewed with the  patient and are negative.  PHYSICAL EXAMINATION: ECOG PERFORMANCE STATUS: 0 - Asymptomatic  Filed Vitals:   06/16/13 1514  BP: 132/80  Pulse: 98  Temp: 98.3 F (36.8 C)  Resp: 18   Filed Weights   06/16/13 1514  Weight: 182 lb 3.2 oz (82.645 kg)    GENERAL:alert, no distress and comfortable SKIN: skin color, texture, turgor are normal, no rashes or significant lesions EYES: normal, Conjunctiva are pink and non-injected, sclera clear OROPHARYNX:no exudate, no erythema and lips, buccal mucosa, and tongue normal  NECK: supple, thyroid normal size, non-tender, without nodularity LYMPH:  no palpable lymphadenopathy in the cervical, axillary or inguinal LUNGS: clear to auscultation and percussion with normal breathing effort HEART: regular rate & rhythm and no murmurs and no lower extremity edema ABDOMEN:abdomen soft, non-tender and normal bowel sounds. Well-healed surgical scar Musculoskeletal:no cyanosis of digits and no clubbing  NEURO: alert & oriented x 3 with fluent speech, no focal motor/sensory deficits  LABORATORY DATA:  I have reviewed the data as listed    Component Value Date/Time   NA 142 06/09/2013 1525   NA 140 11/15/2011 1014   NA 140 10/02/2010 1522   K 3.9 06/09/2013 1525   K 4.3 11/15/2011 1014   K 4.2 10/02/2010 1522   CL 103 05/15/2012 0912   CL 103 11/15/2011 1014   CL 103 10/02/2010 1522   CO2 28 06/09/2013 1525   CO2 28 11/15/2011 1014   CO2 29 10/02/2010 1522   GLUCOSE 89 06/09/2013 1525   GLUCOSE 84 05/15/2012 0912   GLUCOSE 82 11/15/2011 1014   GLUCOSE 93 10/02/2010 1522   BUN 12.2 06/09/2013 1525   BUN 10 11/15/2011 1014  BUN 11 10/02/2010 1522   CREATININE 0.8 06/09/2013 1525   CREATININE 0.76 11/15/2011 1014   CREATININE 0.6 10/02/2010 1522   CALCIUM 9.5 06/09/2013 1525   CALCIUM 9.3 11/15/2011 1014   CALCIUM 9.0 10/02/2010 1522   PROT 8.3 06/09/2013 1525   PROT 7.3 11/15/2011 1014   PROT 8.1 10/02/2010 1522   ALBUMIN 4.1 06/09/2013 1525   ALBUMIN 4.3 11/15/2011 1014    AST 20 06/09/2013 1525   AST 17 11/15/2011 1014   AST 24 10/02/2010 1522   ALT 20 06/09/2013 1525   ALT 18 11/15/2011 1014   ALT 18 10/02/2010 1522   ALKPHOS 69 06/09/2013 1525   ALKPHOS 59 11/15/2011 1014   ALKPHOS 71 10/02/2010 1522   BILITOT 0.31 06/09/2013 1525   BILITOT 0.4 11/15/2011 1014   BILITOT 0.50 10/02/2010 1522   GFRNONAA >60 03/06/2008 2045   GFRAA  Value: >60        The eGFR has been calculated using the MDRD equation. This calculation has not been validated in all clinical 03/06/2008 2045    No results found for this basename: SPEP, UPEP,  kappa and lambda light chains    Lab Results  Component Value Date   WBC 7.6 06/09/2013   NEUTROABS 4.3 06/09/2013   HGB 13.8 06/09/2013   HCT 42.3 06/09/2013   MCV 93.8 06/09/2013   PLT 250 06/09/2013      Chemistry      Component Value Date/Time   NA 142 06/09/2013 1525   NA 140 11/15/2011 1014   NA 140 10/02/2010 1522   K 3.9 06/09/2013 1525   K 4.3 11/15/2011 1014   K 4.2 10/02/2010 1522   CL 103 05/15/2012 0912   CL 103 11/15/2011 1014   CL 103 10/02/2010 1522   CO2 28 06/09/2013 1525   CO2 28 11/15/2011 1014   CO2 29 10/02/2010 1522   BUN 12.2 06/09/2013 1525   BUN 10 11/15/2011 1014   BUN 11 10/02/2010 1522   CREATININE 0.8 06/09/2013 1525   CREATININE 0.76 11/15/2011 1014   CREATININE 0.6 10/02/2010 1522      Component Value Date/Time   CALCIUM 9.5 06/09/2013 1525   CALCIUM 9.3 11/15/2011 1014   CALCIUM 9.0 10/02/2010 1522   ALKPHOS 69 06/09/2013 1525   ALKPHOS 59 11/15/2011 1014   ALKPHOS 71 10/02/2010 1522   AST 20 06/09/2013 1525   AST 17 11/15/2011 1014   AST 24 10/02/2010 1522   ALT 20 06/09/2013 1525   ALT 18 11/15/2011 1014   ALT 18 10/02/2010 1522   BILITOT 0.31 06/09/2013 1525   BILITOT 0.4 11/15/2011 1014   BILITOT 0.50 10/02/2010 1522       RADIOGRAPHIC STUDIES: CT scan were review myself we show no evidence of recurrence I have personally reviewed the radiological images as listed and agreed with the findings in the  report.  ASSESSMENT & PLAN:  #1 neuroendocrine tumor status post resection I recommend she call her gastroenterologist for colonoscopy if it has not been done in the last 5 years. I would discontinue imaging study. I will see her with history, physical examination, and blood work. I will order periodic ultrasound of the abdomen every few years to rule out recurrence of disease. She is in agreement. #2 preventive care She is up-to-date with mammogram, Pap smear and influenza vaccination.  Orders Placed This Encounter  Procedures  . CBC with Differential    Standing Status: Future     Number of Occurrences:  Standing Expiration Date: 06/16/2014  . Comprehensive metabolic panel    Standing Status: Future     Number of Occurrences:      Standing Expiration Date: 06/16/2014   All questions were answered. The patient knows to call the clinic with any problems, questions or concerns. No barriers to learning was detected. I spent 25 minutes counseling the patient face to face. The total time spent in the appointment was 40 minutes and more than 50% was on counseling and review of test results     [facility], [place], MD 06/16/2013 4:38 PM

## 2013-07-28 ENCOUNTER — Ambulatory Visit (INDEPENDENT_AMBULATORY_CARE_PROVIDER_SITE_OTHER): Payer: 59 | Admitting: Podiatrist

## 2013-07-28 DIAGNOSIS — Q6650 Congenital pes planus, unspecified foot: Secondary | ICD-10-CM

## 2013-07-28 NOTE — Progress Notes (Signed)
Patient presents to pick up orthotics-- they are dispensed and instructions given.  She will return in 1 month for a recheck

## 2013-11-15 ENCOUNTER — Other Ambulatory Visit: Payer: Self-pay

## 2013-11-15 DIAGNOSIS — Z1231 Encounter for screening mammogram for malignant neoplasm of breast: Secondary | ICD-10-CM

## 2013-12-24 ENCOUNTER — Ambulatory Visit: Payer: 59

## 2013-12-31 ENCOUNTER — Ambulatory Visit
Admission: RE | Admit: 2013-12-31 | Discharge: 2013-12-31 | Disposition: A | Discharge status: Home / Self Care | Payer: 59 | Source: Ambulatory Visit

## 2013-12-31 DIAGNOSIS — Z1231 Encounter for screening mammogram for malignant neoplasm of breast: Secondary | ICD-10-CM

## 2014-03-31 ENCOUNTER — Telehealth: Payer: Self-pay | Admitting: *Deleted

## 2014-04-01 NOTE — Telephone Encounter (Signed)
Pt left VM on 12/17 stating she wants to move her appt w/ Dr. Alvy Bimler up sooner than scheduled in March.  Called pt back yesterday and left VM asking her to return nurse's call w/ more information. Need to know reason for moving up appt to know how soon to schedule her.

## 2014-06-17 ENCOUNTER — Ambulatory Visit (HOSPITAL_BASED_OUTPATIENT_CLINIC_OR_DEPARTMENT_OTHER): Payer: 59 | Admitting: Hematology and Oncology

## 2014-06-17 ENCOUNTER — Telehealth: Payer: Self-pay | Admitting: Hematology and Oncology

## 2014-06-17 ENCOUNTER — Encounter: Payer: Self-pay | Admitting: Hematology and Oncology

## 2014-06-17 ENCOUNTER — Other Ambulatory Visit (HOSPITAL_BASED_OUTPATIENT_CLINIC_OR_DEPARTMENT_OTHER): Payer: 59

## 2014-06-17 ENCOUNTER — Other Ambulatory Visit: Payer: Self-pay | Admitting: *Deleted

## 2014-06-17 VITALS — BP 136/91 | HR 74 | Temp 98.4°F | Resp 18 | Ht 64.0 in | Wt 176.6 lb

## 2014-06-17 DIAGNOSIS — D489 Neoplasm of uncertain behavior, unspecified: Secondary | ICD-10-CM

## 2014-06-17 DIAGNOSIS — D3A8 Other benign neuroendocrine tumors: Secondary | ICD-10-CM

## 2014-06-17 DIAGNOSIS — R221 Localized swelling, mass and lump, neck: Secondary | ICD-10-CM

## 2014-06-17 LAB — CBC WITH DIFFERENTIAL/PLATELET
BASO%: 0.2 % (ref 0.0–2.0)
Basophils Absolute: 0 10*3/uL (ref 0.0–0.1)
EOS%: 0.9 % (ref 0.0–7.0)
Eosinophils Absolute: 0.1 10*3/uL (ref 0.0–0.5)
HCT: 36.6 % (ref 34.8–46.6)
HGB: 12 g/dL (ref 11.6–15.9)
LYMPH%: 35.8 % (ref 14.0–49.7)
MCH: 30.1 pg (ref 25.1–34.0)
MCHC: 32.8 g/dL (ref 31.5–36.0)
MCV: 91.7 fL (ref 79.5–101.0)
MONO#: 0.7 10*3/uL (ref 0.1–0.9)
MONO%: 13.1 % (ref 0.0–14.0)
NEUT#: 2.7 10*3/uL (ref 1.5–6.5)
NEUT%: 50 % (ref 38.4–76.8)
Platelets: 239 10*3/uL (ref 145–400)
RBC: 3.99 10*6/uL (ref 3.70–5.45)
RDW: 13.4 % (ref 11.2–14.5)
WBC: 5.3 10*3/uL (ref 3.9–10.3)
lymph#: 1.9 10*3/uL (ref 0.9–3.3)

## 2014-06-17 LAB — COMPREHENSIVE METABOLIC PANEL (CC13)
ALT: 16 U/L (ref 0–55)
AST: 17 U/L (ref 5–34)
Albumin: 3.4 g/dL — ABNORMAL LOW (ref 3.5–5.0)
Alkaline Phosphatase: 65 U/L (ref 40–150)
Anion Gap: 10 mEq/L (ref 3–11)
BUN: 7.1 mg/dL (ref 7.0–26.0)
CO2: 24 mEq/L (ref 22–29)
Calcium: 9 mg/dL (ref 8.4–10.4)
Chloride: 106 mEq/L (ref 98–109)
Creatinine: 0.8 mg/dL (ref 0.6–1.1)
EGFR: >90 mL/min/{1.73_m2} (ref >90)
Glucose: 106 mg/dl (ref 70–140)
Potassium: 3.9 mEq/L (ref 3.5–5.1)
Sodium: 140 mEq/L (ref 136–145)
Total Bilirubin: 0.28 mg/dL (ref 0.20–1.20)
Total Protein: 7.2 g/dL (ref 6.4–8.3)

## 2014-06-17 NOTE — Telephone Encounter (Signed)
gv and printed appt shced and avs for pt for May

## 2014-06-17 NOTE — Assessment & Plan Note (Signed)
The patient has not needed treatment. Her last CT scan in 2014 show no evidence of disease. However, she had new onset of passage of loose stool over the past few months. She had recent upper respiratory infection. She denies recent weight loss. She denies crampy abdominal pain. I told the patient to avoid dairy products. We will reassess in one month. If she is still have persistent symptoms, I will order imaging study and she agreed to proceed.

## 2014-06-17 NOTE — Progress Notes (Signed)
[facility] OFFICE PROGRESS NOTE  Patient Care Team: Kandice Hams, MD as PCP - General (Internal Medicine) DIAGNOSIS: Neuroendocrine tumor, low grade, ilium in origin status post resection in September 2009, on observation  SUMMARY OF ONCOLOGIC HISTORY: [name] was discovered to have neuroendocrine tumor after colonoscopy. The patient was not symptomatic prior to diagnosis. She underwent surgery followed by observation.  INTERVAL HISTORY: Please see below for problem oriented charting. She complained of bowel habits changes with new onset of loose stool. Denies crampy abdominal pain or weight loss. She also complained of upper respiratory infection recently but did not receive antibiotics. Most of her symptoms are resolving except for the loose stools  REVIEW OF SYSTEMS:   Constitutional: Denies fevers, chills or abnormal weight loss Eyes: Denies blurriness of vision Ears, nose, mouth, throat, and face: Denies mucositis or sore throat Respiratory: Denies cough, dyspnea or wheezes Cardiovascular: Denies palpitation, chest discomfort or lower extremity swelling Skin: Denies abnormal skin rashes Lymphatics: Denies new lymphadenopathy or easy bruising Neurological:Denies numbness, tingling or new weaknesses Behavioral/Psych: Mood is stable, no new changes  All other systems were reviewed with the patient and are negative.  I have reviewed the past medical history, past surgical history, social history and family history with the patient and they are unchanged from previous note.  ALLERGIES:  is allergic to latex.  MEDICATIONS:  Current Outpatient Prescriptions  Medication Sig Dispense Refill  . ALPRAZolam (XANAX) 0.5 MG tablet Take 0.5 mg by mouth as needed.    . Multiple Vitamins-Minerals (MULTIVITAMIN PO) Take 1 tablet by mouth daily.    Marland Kitchen zolpidem (AMBIEN CR) 12.5 MG CR tablet Take 12.5 mg by mouth at bedtime as needed.     No current  facility-administered medications for this visit.    PHYSICAL EXAMINATION: ECOG PERFORMANCE STATUS: 0 - Asymptomatic  Filed Vitals:   06/17/14 1520  BP: 136/91  Pulse: 74  Temp: 98.4 F (36.9 C)  Resp: 18   Filed Weights   06/17/14 1520  Weight: 176 lb 9.6 oz (80.105 kg)    GENERAL:alert, no distress and comfortable. She is moderately obese SKIN: skin color, texture, turgor are normal, no rashes or significant lesions EYES: normal, Conjunctiva are pink and non-injected, sclera clear OROPHARYNX:no exudate, no erythema and lips, buccal mucosa, and tongue normal  NECK: well-healed thyroidectomy scar. LYMPH:  New lymphadenopathy in the right supraclavicular region, non-elsewhere. LUNGS: clear to auscultation and percussion with normal breathing effort HEART: regular rate & rhythm and no murmurs and no lower extremity edema ABDOMEN:abdomen soft, non-tender and normal bowel sounds Musculoskeletal:no cyanosis of digits and no clubbing  NEURO: alert & oriented x 3 with fluent speech, no focal motor/sensory deficits  LABORATORY DATA:  I have reviewed the data as listed    Component Value Date/Time   NA 140 06/17/2014 1451   NA 140 11/15/2011 1014   NA 140 10/02/2010 1522   K 3.9 06/17/2014 1451   K 4.3 11/15/2011 1014   K 4.2 10/02/2010 1522   CL 103 05/15/2012 0912   CL 103 11/15/2011 1014   CL 103 10/02/2010 1522   CO2 24 06/17/2014 1451   CO2 28 11/15/2011 1014   CO2 29 10/02/2010 1522   GLUCOSE 106 06/17/2014 1451   GLUCOSE 84 05/15/2012 0912   GLUCOSE 82 11/15/2011 1014   GLUCOSE 93 10/02/2010 1522   BUN 7.1 06/17/2014 1451   BUN 10 11/15/2011 1014   BUN 11 10/02/2010 1522  CREATININE 0.8 06/17/2014 1451   CREATININE 0.76 11/15/2011 1014   CREATININE 0.6 10/02/2010 1522   CALCIUM 9.0 06/17/2014 1451   CALCIUM 9.3 11/15/2011 1014   CALCIUM 9.0 10/02/2010 1522   PROT 7.2 06/17/2014 1451   PROT 7.3 11/15/2011 1014   PROT 8.1 10/02/2010 1522   ALBUMIN 3.4*  06/17/2014 1451   ALBUMIN 4.3 11/15/2011 1014   AST 17 06/17/2014 1451   AST 17 11/15/2011 1014   AST 24 10/02/2010 1522   ALT 16 06/17/2014 1451   ALT 18 11/15/2011 1014   ALT 18 10/02/2010 1522   ALKPHOS 65 06/17/2014 1451   ALKPHOS 59 11/15/2011 1014   ALKPHOS 71 10/02/2010 1522   BILITOT 0.28 06/17/2014 1451   BILITOT 0.4 11/15/2011 1014   BILITOT 0.50 10/02/2010 1522   GFRNONAA >60 03/06/2008 2045   GFRAA  03/06/2008 2045    >60        The eGFR has been calculated using the MDRD equation. This calculation has not been validated in all clinical    No results found for: SPEP, UPEP  Lab Results  Component Value Date   WBC 5.3 06/17/2014   NEUTROABS 2.7 06/17/2014   HGB 12.0 06/17/2014   HCT 36.6 06/17/2014   MCV 91.7 06/17/2014   PLT 239 06/17/2014      Chemistry      Component Value Date/Time   NA 140 06/17/2014 1451   NA 140 11/15/2011 1014   NA 140 10/02/2010 1522   K 3.9 06/17/2014 1451   K 4.3 11/15/2011 1014   K 4.2 10/02/2010 1522   CL 103 05/15/2012 0912   CL 103 11/15/2011 1014   CL 103 10/02/2010 1522   CO2 24 06/17/2014 1451   CO2 28 11/15/2011 1014   CO2 29 10/02/2010 1522   BUN 7.1 06/17/2014 1451   BUN 10 11/15/2011 1014   BUN 11 10/02/2010 1522   CREATININE 0.8 06/17/2014 1451   CREATININE 0.76 11/15/2011 1014   CREATININE 0.6 10/02/2010 1522      Component Value Date/Time   CALCIUM 9.0 06/17/2014 1451   CALCIUM 9.3 11/15/2011 1014   CALCIUM 9.0 10/02/2010 1522   ALKPHOS 65 06/17/2014 1451   ALKPHOS 59 11/15/2011 1014   ALKPHOS 71 10/02/2010 1522   AST 17 06/17/2014 1451   AST 17 11/15/2011 1014   AST 24 10/02/2010 1522   ALT 16 06/17/2014 1451   ALT 18 11/15/2011 1014   ALT 18 10/02/2010 1522   BILITOT 0.28 06/17/2014 1451   BILITOT 0.4 11/15/2011 1014   BILITOT 0.50 10/02/2010 1522      ASSESSMENT & PLAN:  Neuroendocrine tumor The patient has not needed treatment. Her last CT scan in 2014 show no evidence of  disease. However, she had new onset of passage of loose stool over the past few months. She had recent upper respiratory infection. She denies recent weight loss. She denies crampy abdominal pain. I told the patient to avoid dairy products. We will reassess in one month. If she is still have persistent symptoms, I will order imaging study and she agreed to proceed.   Mass of right side of neck She has palpable lymphadenopathy in the right neck and supraclavicular region. The patient had recent upper respiratory tract infection. She is not symptomatic. I recommend reassess in the months. If the lymphadenopathy does not go away, I recommend CT imaging and she agreed to proceed    Orders Placed This Encounter  Procedures  . Comprehensive  metabolic panel    Standing Status: Future     Number of Occurrences:      Standing Expiration Date: 07/22/2015  . CBC with Differential/Platelet    Standing Status: Future     Number of Occurrences:      Standing Expiration Date: 07/22/2015  . Lactate dehydrogenase    Standing Status: Future     Number of Occurrences:      Standing Expiration Date: 07/22/2015   All questions were answered. The patient knows to call the clinic with any problems, questions or concerns. No barriers to learning was detected. I spent 15 minutes counseling the patient face to face. The total time spent in the appointment was 20 minutes and more than 50% was on counseling and review of test results     [facility], [place], MD 06/17/2014 4:51 PM

## 2014-06-17 NOTE — Assessment & Plan Note (Signed)
She has palpable lymphadenopathy in the right neck and supraclavicular region. The patient had recent upper respiratory tract infection. She is not symptomatic. I recommend reassess in the months. If the lymphadenopathy does not go away, I recommend CT imaging and she agreed to proceed

## 2014-08-19 ENCOUNTER — Encounter: Payer: Self-pay | Admitting: Hematology and Oncology

## 2014-08-19 ENCOUNTER — Other Ambulatory Visit: Payer: Self-pay | Admitting: Hematology and Oncology

## 2014-08-19 ENCOUNTER — Telehealth: Payer: Self-pay | Admitting: Hematology and Oncology

## 2014-08-19 ENCOUNTER — Ambulatory Visit (HOSPITAL_BASED_OUTPATIENT_CLINIC_OR_DEPARTMENT_OTHER): Payer: 59 | Admitting: Hematology and Oncology

## 2014-08-19 ENCOUNTER — Other Ambulatory Visit (HOSPITAL_BASED_OUTPATIENT_CLINIC_OR_DEPARTMENT_OTHER): Payer: BLUE CROSS/BLUE SHIELD

## 2014-08-19 VITALS — BP 139/63 | HR 87 | Temp 98.0°F | Resp 18 | Ht 64.0 in | Wt 174.4 lb

## 2014-08-19 DIAGNOSIS — D3A8 Other benign neuroendocrine tumors: Secondary | ICD-10-CM

## 2014-08-19 DIAGNOSIS — D489 Neoplasm of uncertain behavior, unspecified: Secondary | ICD-10-CM

## 2014-08-19 DIAGNOSIS — R221 Localized swelling, mass and lump, neck: Secondary | ICD-10-CM

## 2014-08-19 DIAGNOSIS — R42 Dizziness and giddiness: Secondary | ICD-10-CM

## 2014-08-19 DIAGNOSIS — N951 Menopausal and female climacteric states: Secondary | ICD-10-CM

## 2014-08-19 DIAGNOSIS — R232 Flushing: Secondary | ICD-10-CM

## 2014-08-19 DIAGNOSIS — F419 Anxiety disorder, unspecified: Secondary | ICD-10-CM

## 2014-08-19 LAB — CBC WITH DIFFERENTIAL/PLATELET
BASO%: 0.4 % (ref 0.0–2.0)
Basophils Absolute: 0 10*3/uL (ref 0.0–0.1)
EOS%: 0.7 % (ref 0.0–7.0)
Eosinophils Absolute: 0 10*3/uL (ref 0.0–0.5)
HCT: 39.5 % (ref 34.8–46.6)
HGB: 13.1 g/dL (ref 11.6–15.9)
LYMPH%: 32.2 % (ref 14.0–49.7)
MCH: 30.4 pg (ref 25.1–34.0)
MCHC: 33.3 g/dL (ref 31.5–36.0)
MCV: 91.4 fL (ref 79.5–101.0)
MONO#: 0.5 10*3/uL (ref 0.1–0.9)
MONO%: 9.1 % (ref 0.0–14.0)
NEUT#: 3.4 10*3/uL (ref 1.5–6.5)
NEUT%: 57.6 % (ref 38.4–76.8)
Platelets: 232 10*3/uL (ref 145–400)
RBC: 4.32 10*6/uL (ref 3.70–5.45)
RDW: 13.8 % (ref 11.2–14.5)
WBC: 5.9 10*3/uL (ref 3.9–10.3)
lymph#: 1.9 10*3/uL (ref 0.9–3.3)

## 2014-08-19 LAB — COMPREHENSIVE METABOLIC PANEL (CC13)
ALT: 19 U/L (ref 0–55)
AST: 18 U/L (ref 5–34)
Albumin: 3.8 g/dL (ref 3.5–5.0)
Alkaline Phosphatase: 71 U/L (ref 40–150)
Anion Gap: 11 mEq/L (ref 3–11)
BUN: 10.4 mg/dL (ref 7.0–26.0)
CO2: 24 mEq/L (ref 22–29)
Calcium: 9.2 mg/dL (ref 8.4–10.4)
Chloride: 106 mEq/L (ref 98–109)
Creatinine: 0.8 mg/dL (ref 0.6–1.1)
EGFR: >90 mL/min/{1.73_m2} (ref >90)
Glucose: 98 mg/dl (ref 70–140)
Potassium: 3.6 mEq/L (ref 3.5–5.1)
Sodium: 141 mEq/L (ref 136–145)
Total Bilirubin: 0.41 mg/dL (ref 0.20–1.20)
Total Protein: 7.7 g/dL (ref 6.4–8.3)

## 2014-08-19 LAB — LACTATE DEHYDROGENASE (CC13): LDH: 210 U/L (ref 125–245)

## 2014-08-19 MED ORDER — ALPRAZOLAM 0.5 MG PO TABS: 0.5000 mg | ORAL_TABLET | Freq: Two times a day (BID) | ORAL | Status: DC | PRN

## 2014-08-19 MED ORDER — TRAZODONE HCL 50 MG PO TABS: 50.0000 mg | ORAL_TABLET | Freq: Every day | ORAL | Status: DC

## 2014-08-19 MED ORDER — MECLIZINE HCL 12.5 MG PO TABS: 12.5000 mg | ORAL_TABLET | Freq: Three times a day (TID) | ORAL | Status: DC | PRN

## 2014-08-19 NOTE — Assessment & Plan Note (Signed)
She has a palpable neck mass in the supraclavicular region close to the thyroid gland area. I will recommend CT scan of the chest, neck, abdomen and pelvis for staging to exclude recurrence of neuroendocrine cancer

## 2014-08-19 NOTE — Telephone Encounter (Signed)
Gave and printed appt sched and avs for pt for May...gv barium °

## 2014-08-19 NOTE — Progress Notes (Signed)
[place] OFFICE PROGRESS NOTE  Patient Care Team: Seward Carol, MD as PCP - General (Internal Medicine)  SUMMARY OF ONCOLOGIC HISTORY:  DIAGNOSIS: Neuroendocrine tumor, low grade, ilium in origin status post resection in September 2009, on observation  SUMMARY OF ONCOLOGIC HISTORY: [name] was discovered to have neuroendocrine tumor after colonoscopy. The patient was not symptomatic prior to diagnosis. She underwent surgery followed by observation.  INTERVAL HISTORY: Please see below for problem oriented charting. She returns for further follow-up. Her symptoms have not improved. She continues to have intermittent abdominal cramping with persistent neck mass. She also has occasional hot flashes/night sweats. She complained recent vertigo. She has intermittent anxiety and had poor sleep. She complained of depression. She felt that her depression is situational related. She denies suicidal ideation.  REVIEW OF SYSTEMS:   Constitutional: Denies fevers, chills or abnormal weight loss Eyes: Denies blurriness of vision Ears, nose, mouth, throat, and face: Denies mucositis or sore throat Respiratory: Denies cough, dyspnea or wheezes Cardiovascular: Denies palpitation, chest discomfort or lower extremity swelling Skin: Denies abnormal skin rashes Neurological:Denies numbness, tingling or new weaknesses  All other systems were reviewed with the patient and are negative.  I have reviewed the past medical history, past surgical history, social history and family history with the patient and they are unchanged from previous note.  ALLERGIES:  is allergic to latex.  MEDICATIONS:  Current Outpatient Prescriptions  Medication Sig Dispense Refill  . ALPRAZolam (XANAX) 0.5 MG tablet Take 0.5 mg by mouth as needed.    . Multiple Vitamins-Minerals (MULTIVITAMIN PO) Take 1 tablet by mouth daily.    Marland Kitchen zolpidem (AMBIEN CR) 12.5 MG CR tablet Take 12.5 mg by  mouth at bedtime as needed.    . ALPRAZolam (XANAX) 0.5 MG tablet Take 1 tablet (0.5 mg total) by mouth 2 (two) times daily as needed for anxiety. 30 tablet 0  . meclizine (ANTIVERT) 12.5 MG tablet Take 1 tablet (12.5 mg total) by mouth 3 (three) times daily as needed for dizziness. 60 tablet 0  . traZODone (DESYREL) 50 MG tablet Take 1 tablet (50 mg total) by mouth at bedtime. 30 tablet 1   No current facility-administered medications for this visit.    PHYSICAL EXAMINATION: ECOG PERFORMANCE STATUS: 1 - Symptomatic but completely ambulatory  Filed Vitals:   08/19/14 1317  BP: 139/63  Pulse: 87  Temp: 98 F (36.7 C)  Resp: 18   Filed Weights   08/19/14 1317  Weight: 174 lb 6.4 oz (79.107 kg)    GENERAL:alert, no distress and comfortable SKIN: skin color, texture, turgor are normal, no rashes or significant lesions EYES: normal, Conjunctiva are pink and non-injected, sclera clear OROPHARYNX:no exudate, no erythema and lips, buccal mucosa, and tongue normal  NECK: Well-healed thyroidectomy scar. LYMPH:  Persistent palpable lymphadenopathy in the supraclavicular region on the right side close to the surgical scar area.  LUNGS: clear to auscultation and percussion with normal breathing effort HEART: regular rate & rhythm and no murmurs and no lower extremity edema ABDOMEN:abdomen soft, non-tender and normal bowel sounds Musculoskeletal:no cyanosis of digits and no clubbing  NEURO: alert & oriented x 3 with fluent speech, no focal motor/sensory deficits  LABORATORY DATA:  I have reviewed the data as listed    Component Value Date/Time   NA 140 06/17/2014 1451   NA 140 11/15/2011 1014   NA 140 10/02/2010 1522   K 3.9 06/17/2014 1451   K 4.3 11/15/2011 1014  K 4.2 10/02/2010 1522   CL 103 05/15/2012 0912   CL 103 11/15/2011 1014   CL 103 10/02/2010 1522   CO2 24 06/17/2014 1451   CO2 28 11/15/2011 1014   CO2 29 10/02/2010 1522   GLUCOSE 106 06/17/2014 1451   GLUCOSE 84  05/15/2012 0912   GLUCOSE 82 11/15/2011 1014   GLUCOSE 93 10/02/2010 1522   BUN 7.1 06/17/2014 1451   BUN 10 11/15/2011 1014   BUN 11 10/02/2010 1522   CREATININE 0.8 06/17/2014 1451   CREATININE 0.76 11/15/2011 1014   CREATININE 0.6 10/02/2010 1522   CALCIUM 9.0 06/17/2014 1451   CALCIUM 9.3 11/15/2011 1014   CALCIUM 9.0 10/02/2010 1522   PROT 7.2 06/17/2014 1451   PROT 7.3 11/15/2011 1014   PROT 8.1 10/02/2010 1522   ALBUMIN 3.4* 06/17/2014 1451   ALBUMIN 4.3 11/15/2011 1014   AST 17 06/17/2014 1451   AST 17 11/15/2011 1014   AST 24 10/02/2010 1522   ALT 16 06/17/2014 1451   ALT 18 11/15/2011 1014   ALT 18 10/02/2010 1522   ALKPHOS 65 06/17/2014 1451   ALKPHOS 59 11/15/2011 1014   ALKPHOS 71 10/02/2010 1522   BILITOT 0.28 06/17/2014 1451   BILITOT 0.4 11/15/2011 1014   BILITOT 0.50 10/02/2010 1522   GFRNONAA >60 03/06/2008 2045   GFRAA  03/06/2008 2045    >60        The eGFR has been calculated using the MDRD equation. This calculation has not been validated in all clinical    No results found for: SPEP, UPEP  Lab Results  Component Value Date   WBC 5.9 08/19/2014   NEUTROABS 3.4 08/19/2014   HGB 13.1 08/19/2014   HCT 39.5 08/19/2014   MCV 91.4 08/19/2014   PLT 232 08/19/2014      Chemistry      Component Value Date/Time   NA 140 06/17/2014 1451   NA 140 11/15/2011 1014   NA 140 10/02/2010 1522   K 3.9 06/17/2014 1451   K 4.3 11/15/2011 1014   K 4.2 10/02/2010 1522   CL 103 05/15/2012 0912   CL 103 11/15/2011 1014   CL 103 10/02/2010 1522   CO2 24 06/17/2014 1451   CO2 28 11/15/2011 1014   CO2 29 10/02/2010 1522   BUN 7.1 06/17/2014 1451   BUN 10 11/15/2011 1014   BUN 11 10/02/2010 1522   CREATININE 0.8 06/17/2014 1451   CREATININE 0.76 11/15/2011 1014   CREATININE 0.6 10/02/2010 1522      Component Value Date/Time   CALCIUM 9.0 06/17/2014 1451   CALCIUM 9.3 11/15/2011 1014   CALCIUM 9.0 10/02/2010 1522   ALKPHOS 65 06/17/2014 1451    ALKPHOS 59 11/15/2011 1014   ALKPHOS 71 10/02/2010 1522   AST 17 06/17/2014 1451   AST 17 11/15/2011 1014   AST 24 10/02/2010 1522   ALT 16 06/17/2014 1451   ALT 18 11/15/2011 1014   ALT 18 10/02/2010 1522   BILITOT 0.28 06/17/2014 1451   BILITOT 0.4 11/15/2011 1014   BILITOT 0.50 10/02/2010 1522      ASSESSMENT & PLAN:  Neuroendocrine tumor She has multiple nonspecific complaints. I'm concerned about her abdominal cramping and diarrhea, could be a sign of disease in her abdomen. She also had palpable lymphadenopathy in the right supraclavicular region. Recommend CT scan of the neck, chest abdomen and pelvis for staging. I will see her back in 2 weeks for further assessment.   Mass of right  side of neck She has a palpable neck mass in the supraclavicular region close to the thyroid gland area. I will recommend CT scan of the chest, neck, abdomen and pelvis for staging to exclude recurrence of neuroendocrine cancer   Vertigo She has intermittent vertigo which resolved with meclizine. Give her prescription for this.   Anxiety disorder She has nonspecific generalized anxiety disorder with poor sleep and mild depression. I give her a small prescription for Xanax but also request her to try trazodone at nighttime. I will reassess in 2 weeks.   Hot flashes She complained of intermittent hot flashes or sweats. This is nonspecific, could be due to menopausal symptoms or could be due to cancer recurrence. As above, we will order imaging study for further assessment.    Orders Placed This Encounter  Procedures  . CT Chest W Contrast    Standing Status: Future     Number of Occurrences:      Standing Expiration Date: 10/19/2015    Order Specific Question:  Reason for Exam (SYMPTOM  OR DIAGNOSIS REQUIRED)    Answer:  hx neuroendocrine tumor, exclude ca    Order Specific Question:  Preferred imaging location?    Answer:  [facility]  . CT Abdomen Pelvis W Contrast     Standing Status: Future     Number of Occurrences:      Standing Expiration Date: 11/19/2015    Order Specific Question:  Reason for Exam (SYMPTOM  OR DIAGNOSIS REQUIRED)    Answer:  hx neuroendocrine tumor, exclude ca    Order Specific Question:  Preferred imaging location?    Answer:  [facility]  . CT Soft Tissue Neck W Contrast    Standing Status: Future     Number of Occurrences:      Standing Expiration Date: 11/19/2015    Order Specific Question:  Reason for Exam (SYMPTOM  OR DIAGNOSIS REQUIRED)    Answer:  neck mass, hx neuroendocrine tumor, exclude ca    Order Specific Question:  Preferred imaging location?    Answer:  [facility]  . TSH    Standing Status: Future     Number of Occurrences:      Standing Expiration Date: 09/23/2015  . Comprehensive metabolic panel    Standing Status: Future     Number of Occurrences:      Standing Expiration Date: 09/23/2015  . CBC with Differential/Platelet    Standing Status: Future     Number of Occurrences:      Standing Expiration Date: 09/23/2015  . Lactate dehydrogenase    Standing Status: Future     Number of Occurrences:      Standing Expiration Date: 09/23/2015   All questions were answered. The patient knows to call the clinic with any problems, questions or concerns. No barriers to learning was detected. I spent 30 minutes counseling the patient face to face. The total time spent in the appointment was 40 minutes and more than 50% was on counseling and review of test results     [facility], Velva Molinari, MD 08/19/2014 1:52 PM

## 2014-08-19 NOTE — Assessment & Plan Note (Signed)
She has nonspecific generalized anxiety disorder with poor sleep and mild depression. I give her a small prescription for Xanax but also request her to try trazodone at nighttime. I will reassess in 2 weeks.

## 2014-08-19 NOTE — Assessment & Plan Note (Signed)
She complained of intermittent hot flashes or sweats. This is nonspecific, could be due to menopausal symptoms or could be due to cancer recurrence. As above, we will order imaging study for further assessment.

## 2014-08-19 NOTE — Assessment & Plan Note (Signed)
She has intermittent vertigo which resolved with meclizine. Give her prescription for this.

## 2014-08-19 NOTE — Assessment & Plan Note (Signed)
She has multiple nonspecific complaints. I'm concerned about her abdominal cramping and diarrhea, could be a sign of disease in her abdomen. She also had palpable lymphadenopathy in the right supraclavicular region. Recommend CT scan of the neck, chest abdomen and pelvis for staging. I will see her back in 2 weeks for further assessment.

## 2014-09-01 ENCOUNTER — Ambulatory Visit (HOSPITAL_COMMUNITY)
Admission: RE | Admit: 2014-09-01 | Discharge: 2014-09-01 | Disposition: A | Discharge status: Home / Self Care | Payer: 59 | Source: Ambulatory Visit | Attending: Hematology and Oncology | Admitting: Hematology and Oncology

## 2014-09-01 ENCOUNTER — Other Ambulatory Visit (HOSPITAL_BASED_OUTPATIENT_CLINIC_OR_DEPARTMENT_OTHER): Payer: BLUE CROSS/BLUE SHIELD

## 2014-09-01 DIAGNOSIS — D3A8 Other benign neuroendocrine tumors: Secondary | ICD-10-CM

## 2014-09-01 DIAGNOSIS — R109 Unspecified abdominal pain: Secondary | ICD-10-CM | POA: Diagnosis not present

## 2014-09-01 DIAGNOSIS — R221 Localized swelling, mass and lump, neck: Secondary | ICD-10-CM | POA: Insufficient documentation

## 2014-09-01 LAB — BUN AND CREATININE (CC13)
BUN: 11.2 mg/dL (ref 7.0–26.0)
Creatinine: 0.8 mg/dL (ref 0.6–1.1)
EGFR: >90 mL/min/{1.73_m2} (ref >90)

## 2014-09-01 MED ORDER — IOHEXOL 300 MG/ML  SOLN
100.0000 mL | Freq: Once | INTRAMUSCULAR | Status: AC | PRN
  Administered 2014-09-01: 100 mL via INTRAVENOUS

## 2014-09-02 ENCOUNTER — Telehealth: Payer: Self-pay | Admitting: Hematology and Oncology

## 2014-09-02 ENCOUNTER — Ambulatory Visit (HOSPITAL_BASED_OUTPATIENT_CLINIC_OR_DEPARTMENT_OTHER): Payer: 59 | Admitting: Hematology and Oncology

## 2014-09-02 VITALS — BP 137/75 | HR 75 | Temp 98.3°F | Resp 18 | Ht 64.0 in | Wt 175.5 lb

## 2014-09-02 DIAGNOSIS — R221 Localized swelling, mass and lump, neck: Secondary | ICD-10-CM | POA: Diagnosis not present

## 2014-09-02 DIAGNOSIS — D3A8 Other benign neuroendocrine tumors: Secondary | ICD-10-CM

## 2014-09-02 DIAGNOSIS — Z859 Personal history of malignant neoplasm, unspecified: Secondary | ICD-10-CM | POA: Diagnosis not present

## 2014-09-02 LAB — TSH CHCC: TSH: 1.817 m(IU)/L (ref 0.308–3.960)

## 2014-09-02 NOTE — Progress Notes (Signed)
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 The patient was not symptomatic prior to diagnosis. She underwent surgery followed by observation.  repeat CT scan in May 2016 show no evidence of cancer  INTERVAL HISTORY: Please see below for problem oriented charting.  she feels well.  REVIEW OF SYSTEMS:   Constitutional: Denies fevers, chills or abnormal weight loss Eyes: Denies blurriness of vision Ears, nose, mouth, throat, and face: Denies mucositis or sore throat Respiratory: Denies cough, dyspnea or wheezes Cardiovascular: Denies palpitation, chest discomfort or lower extremity swelling Gastrointestinal:  Denies nausea, heartburn or change in bowel habits Skin: Denies abnormal skin rashes Lymphatics: Denies new lymphadenopathy or easy bruising Neurological:Denies numbness, tingling or new weaknesses Behavioral/Psych: Mood is stable, no new changes  All other systems were reviewed with the patient and are negative.  I have reviewed the past medical history, past surgical history, social history and family history with the patient and they are unchanged from previous note.  ALLERGIES:  is allergic to latex.  MEDICATIONS:  Current Outpatient Prescriptions  Medication Sig Dispense Refill  . ALPRAZolam (XANAX) 0.5 MG tablet Take 0.5 mg by mouth as needed.    . ALPRAZolam (XANAX) 0.5 MG tablet Take 1 tablet (0.5 mg total) by mouth 2 (two) times daily as needed for anxiety. 30 tablet 0  . meclizine (ANTIVERT) 12.5 MG tablet Take 1 tablet (12.5 mg total) by mouth 3 (three) times daily as needed for dizziness. 60 tablet 0  . Multiple  Vitamins-Minerals (MULTIVITAMIN PO) Take 1 tablet by mouth daily.    . traZODone (DESYREL) 50 MG tablet Take 1 tablet (50 mg total) by mouth at bedtime. 30 tablet 1  . zolpidem (AMBIEN CR) 12.5 MG CR tablet Take 12.5 mg by mouth at bedtime as needed.     No current facility-administered medications for this visit.    PHYSICAL EXAMINATION: ECOG PERFORMANCE STATUS: 0 - Asymptomatic  Filed Vitals:   09/02/14 1114  BP: 137/75  Pulse: 75  Temp: 98.3 F (36.8 C)  Resp: 18   Filed Weights   09/02/14 1114  Weight: 175 lb 8 oz (79.606 kg)    GENERAL:alert, no distress and comfortable SKIN: skin color, texture, turgor are normal, no rashes or significant lesions EYES: normal, Conjunctiva are pink and non-injected, sclera clear Musculoskeletal:no cyanosis of digits and no clubbing  NEURO: alert & oriented x 3 with fluent speech, no focal motor/sensory deficits  LABORATORY DATA:  I have reviewed the data as listed    Component Value Date/Time   NA 141 08/19/2014 1304   NA 140 11/15/2011 1014   NA 140 10/02/2010 1522   K 3.6 08/19/2014 1304   K 4.3 11/15/2011 1014   K 4.2 10/02/2010 1522   CL 103 05/15/2012 0912   CL 103 11/15/2011 1014   CL 103 10/02/2010 1522   CO2 24 08/19/2014 1304   CO2 28 11/15/2011 1014   CO2 29 10/02/2010 1522   GLUCOSE 98 08/19/2014 1304   GLUCOSE 84 05/15/2012 0912   GLUCOSE 82 11/15/2011 1014   GLUCOSE 93 10/02/2010 1522   BUN 11.2 09/01/2014 1553   BUN 10 11/15/2011 1014   BUN 11 10/02/2010 1522  CREATININE 0.8 09/01/2014 1553   CREATININE 0.76 11/15/2011 1014   CREATININE 0.6 10/02/2010 1522   CALCIUM 9.2 08/19/2014 1304   CALCIUM 9.3 11/15/2011 1014   CALCIUM 9.0 10/02/2010 1522   PROT 7.7 08/19/2014 1304   PROT 7.3 11/15/2011 1014   PROT 8.1 10/02/2010 1522   ALBUMIN 3.8 08/19/2014 1304   ALBUMIN 4.3 11/15/2011 1014   AST 18 08/19/2014 1304   AST 17 11/15/2011 1014   AST 24 10/02/2010 1522   ALT 19 08/19/2014 1304   ALT 18  11/15/2011 1014   ALT 18 10/02/2010 1522   ALKPHOS 71 08/19/2014 1304   ALKPHOS 59 11/15/2011 1014   ALKPHOS 71 10/02/2010 1522   BILITOT 0.41 08/19/2014 1304   BILITOT 0.4 11/15/2011 1014   BILITOT 0.50 10/02/2010 1522   GFRNONAA >60 03/06/2008 2045   GFRAA  03/06/2008 2045    >60        The eGFR has been calculated using the MDRD equation. This calculation has not been validated in all clinical    No results found for: SPEP, UPEP  Lab Results  Component Value Date   WBC 5.9 08/19/2014   NEUTROABS 3.4 08/19/2014   HGB 13.1 08/19/2014   HCT 39.5 08/19/2014   MCV 91.4 08/19/2014   PLT 232 08/19/2014      Chemistry      Component Value Date/Time   NA 141 08/19/2014 1304   NA 140 11/15/2011 1014   NA 140 10/02/2010 1522   K 3.6 08/19/2014 1304   K 4.3 11/15/2011 1014   K 4.2 10/02/2010 1522   CL 103 05/15/2012 0912   CL 103 11/15/2011 1014   CL 103 10/02/2010 1522   CO2 24 08/19/2014 1304   CO2 28 11/15/2011 1014   CO2 29 10/02/2010 1522   BUN 11.2 09/01/2014 1553   BUN 10 11/15/2011 1014   BUN 11 10/02/2010 1522   CREATININE 0.8 09/01/2014 1553   CREATININE 0.76 11/15/2011 1014   CREATININE 0.6 10/02/2010 1522      Component Value Date/Time   CALCIUM 9.2 08/19/2014 1304   CALCIUM 9.3 11/15/2011 1014   CALCIUM 9.0 10/02/2010 1522   ALKPHOS 71 08/19/2014 1304   ALKPHOS 59 11/15/2011 1014   ALKPHOS 71 10/02/2010 1522   AST 18 08/19/2014 1304   AST 17 11/15/2011 1014   AST 24 10/02/2010 1522   ALT 19 08/19/2014 1304   ALT 18 11/15/2011 1014   ALT 18 10/02/2010 1522   BILITOT 0.41 08/19/2014 1304   BILITOT 0.4 11/15/2011 1014   BILITOT 0.50 10/02/2010 1522       RADIOGRAPHIC STUDIES: I have personally reviewed the radiological images as listed and agreed with the findings in the report. Ct Soft Tissue Neck W Contrast  09/01/2014   CLINICAL DATA:  History neuroendocrine tumor. Original lesion and ileum status intra section 2009. Persistent right neck  mass (right supraclavicular region). Abdominal cramping.  EXAM: CT OF THE NECK WITH CONTRAST  CT OF THE CHEST WITH CONTRAST  CT OF THE ABDOMEN AND PELVIS WITH CONTRAST  TECHNIQUE: Multidetector CT imaging of the neck was performed with intravenous contrast.; Multidetector CT imaging of the abdomen and pelvis was performed following the standard protocol during bolus administration of intravenous contrast.; Multidetector CT imaging of the chest was performed following the standard protocol during bolus administration of intravenous contrast.  CONTRAST:  174m OMNIPAQUE IOHEXOL 300 MG/ML  SOLN  COMPARISON:  CT 10/02/2010  FINDINGS: CT NECK FINDINGS  Attention  to the right supraclavicular region: There is no mass or adenopathy evident. The right lobe of thyroid gland is reduced in volume compared to the left suggesting partial right hemithyroidectomy.  The pharyngeal mucosa is symmetric in the oropharynx and hypopharynx. The parapharyngeal spaces are clear. Epiglottis and glottis are normal.  The salivary glands are normal.  No level 2 or level 1 adenopathy.  Limited view of the inferior brain is unremarkable. Orbits are normal.  There is incomplete opacification of the left maxillary sinus.  CT CHEST FINDINGS  Mediastinum/Nodes: No axillary or supraclavicular adenopathy. No mediastinal or hilar lymphadenopathy. No pericardial fluid. Central embolism. Esophagus normal.  Lungs/Pleura: Within the right upper lobe small 2 mm nodule on image 30 series 8 is unchanged. Scatter small nodules in the right middle lobe also unchanged.  CT ABDOMEN AND PELVIS FINDINGS  Hepatobiliary: No focal hepatic lesion. No biliary duct dilatation. Gallbladder is normal. Common bile duct is normal.  Pancreas: Pancreas is normal. No ductal dilatation. No pancreatic inflammation.  Spleen: Normal spleen  Adrenals/urinary tract: Adrenal glands and kidneys are normal. The ureters and bladder normal. Mild nodularity at the dome of the bladder seen  on sagittal projections not changed prior (image 65 of sagittal series)  Stomach/Bowel: Stomach and small bowel normal. No evidence obstruction or mass. Postsurgical change in right colon consistent with ileal resection. Colon and rectosigmoid colon are normal. Normal bowel mesentery.  Vascular/Lymphatic: Abdominal aorta is normal caliber. There is no retroperitoneal or periportal lymphadenopathy. No pelvic lymphadenopathy.  Reproductive: Calcification within the uterus.  Ovaries are normal.  Musculoskeletal: No aggressive osseous lesion.  Other: No free fluid.  IMPRESSION: Neck Impression:  1. No evidence of right supraclavicular adenopathy or mass. 2. Small right lobe of thyroid gland suggests partial right hemithyroidectomy. 3. Left maxillary sinusitis.  Chest Impression:  1. No evidence of metastatic disease 2. Small sub 5 mm pulmonary nodules are stable.  Abdomen / Pelvis Impression:  1. No evidence of local recurrence or metastasis of neuroendocrine tumor in the bowel or mesentery. 2. No liver metastasis. 3. No adenopathy. 4. Calcified lesion within the uterus likely represent leiomyomas.   Electronically Signed   By: Suzy Bouchard M.D.   On: 09/01/2014 18:33   Ct Chest W Contrast  09/01/2014   CLINICAL DATA:  History neuroendocrine tumor. Original lesion and ileum status intra section 2009. Persistent right neck mass (right supraclavicular region). Abdominal cramping.  EXAM: CT OF THE NECK WITH CONTRAST  CT OF THE CHEST WITH CONTRAST  CT OF THE ABDOMEN AND PELVIS WITH CONTRAST  TECHNIQUE: Multidetector CT imaging of the neck was performed with intravenous contrast.; Multidetector CT imaging of the abdomen and pelvis was performed following the standard protocol during bolus administration of intravenous contrast.; Multidetector CT imaging of the chest was performed following the standard protocol during bolus administration of intravenous contrast.  CONTRAST:  132m OMNIPAQUE IOHEXOL 300 MG/ML  SOLN   COMPARISON:  CT 10/02/2010  FINDINGS: CT NECK FINDINGS  Attention to the right supraclavicular region: There is no mass or adenopathy evident. The right lobe of thyroid gland is reduced in volume compared to the left suggesting partial right hemithyroidectomy.  The pharyngeal mucosa is symmetric in the oropharynx and hypopharynx. The parapharyngeal spaces are clear. Epiglottis and glottis are normal.  The salivary glands are normal.  No level 2 or level 1 adenopathy.  Limited view of the inferior brain is unremarkable. Orbits are normal.  There is incomplete opacification of the left maxillary sinus.  CT CHEST FINDINGS  Mediastinum/Nodes: No axillary or supraclavicular adenopathy. No mediastinal or hilar lymphadenopathy. No pericardial fluid. Central embolism. Esophagus normal.  Lungs/Pleura: Within the right upper lobe small 2 mm nodule on image 30 series 8 is unchanged. Scatter small nodules in the right middle lobe also unchanged.  CT ABDOMEN AND PELVIS FINDINGS  Hepatobiliary: No focal hepatic lesion. No biliary duct dilatation. Gallbladder is normal. Common bile duct is normal.  Pancreas: Pancreas is normal. No ductal dilatation. No pancreatic inflammation.  Spleen: Normal spleen  Adrenals/urinary tract: Adrenal glands and kidneys are normal. The ureters and bladder normal. Mild nodularity at the dome of the bladder seen on sagittal projections not changed prior (image 65 of sagittal series)  Stomach/Bowel: Stomach and small bowel normal. No evidence obstruction or mass. Postsurgical change in right colon consistent with ileal resection. Colon and rectosigmoid colon are normal. Normal bowel mesentery.  Vascular/Lymphatic: Abdominal aorta is normal caliber. There is no retroperitoneal or periportal lymphadenopathy. No pelvic lymphadenopathy.  Reproductive: Calcification within the uterus.  Ovaries are normal.  Musculoskeletal: No aggressive osseous lesion.  Other: No free fluid.  IMPRESSION: Neck Impression:  1.  No evidence of right supraclavicular adenopathy or mass. 2. Small right lobe of thyroid gland suggests partial right hemithyroidectomy. 3. Left maxillary sinusitis.  Chest Impression:  1. No evidence of metastatic disease 2. Small sub 5 mm pulmonary nodules are stable.  Abdomen / Pelvis Impression:  1. No evidence of local recurrence or metastasis of neuroendocrine tumor in the bowel or mesentery. 2. No liver metastasis. 3. No adenopathy. 4. Calcified lesion within the uterus likely represent leiomyomas.   Electronically Signed   By: Suzy Bouchard M.D.   On: 09/01/2014 18:33   Ct Abdomen Pelvis W Contrast  09/01/2014   CLINICAL DATA:  History neuroendocrine tumor. Original lesion and ileum status intra section 2009. Persistent right neck mass (right supraclavicular region). Abdominal cramping.  EXAM: CT OF THE NECK WITH CONTRAST  CT OF THE CHEST WITH CONTRAST  CT OF THE ABDOMEN AND PELVIS WITH CONTRAST  TECHNIQUE: Multidetector CT imaging of the neck was performed with intravenous contrast.; Multidetector CT imaging of the abdomen and pelvis was performed following the standard protocol during bolus administration of intravenous contrast.; Multidetector CT imaging of the chest was performed following the standard protocol during bolus administration of intravenous contrast.  CONTRAST:  167m OMNIPAQUE IOHEXOL 300 MG/ML  SOLN  COMPARISON:  CT 10/02/2010  FINDINGS: CT NECK FINDINGS  Attention to the right supraclavicular region: There is no mass or adenopathy evident. The right lobe of thyroid gland is reduced in volume compared to the left suggesting partial right hemithyroidectomy.  The pharyngeal mucosa is symmetric in the oropharynx and hypopharynx. The parapharyngeal spaces are clear. Epiglottis and glottis are normal.  The salivary glands are normal.  No level 2 or level 1 adenopathy.  Limited view of the inferior brain is unremarkable. Orbits are normal.  There is incomplete opacification of the left  maxillary sinus.  CT CHEST FINDINGS  Mediastinum/Nodes: No axillary or supraclavicular adenopathy. No mediastinal or hilar lymphadenopathy. No pericardial fluid. Central embolism. Esophagus normal.  Lungs/Pleura: Within the right upper lobe small 2 mm nodule on image 30 series 8 is unchanged. Scatter small nodules in the right middle lobe also unchanged.  CT ABDOMEN AND PELVIS FINDINGS  Hepatobiliary: No focal hepatic lesion. No biliary duct dilatation. Gallbladder is normal. Common bile duct is normal.  Pancreas: Pancreas is normal. No ductal dilatation. No pancreatic inflammation.  Spleen: Normal spleen  Adrenals/urinary tract: Adrenal glands and kidneys are normal. The ureters and bladder normal. Mild nodularity at the dome of the bladder seen on sagittal projections not changed prior (image 65 of sagittal series)  Stomach/Bowel: Stomach and small bowel normal. No evidence obstruction or mass. Postsurgical change in right colon consistent with ileal resection. Colon and rectosigmoid colon are normal. Normal bowel mesentery.  Vascular/Lymphatic: Abdominal aorta is normal caliber. There is no retroperitoneal or periportal lymphadenopathy. No pelvic lymphadenopathy.  Reproductive: Calcification within the uterus.  Ovaries are normal.  Musculoskeletal: No aggressive osseous lesion.  Other: No free fluid.  IMPRESSION: Neck Impression:  1. No evidence of right supraclavicular adenopathy or mass. 2. Small right lobe of thyroid gland suggests partial right hemithyroidectomy. 3. Left maxillary sinusitis.  Chest Impression:  1. No evidence of metastatic disease 2. Small sub 5 mm pulmonary nodules are stable.  Abdomen / Pelvis Impression:  1. No evidence of local recurrence or metastasis of neuroendocrine tumor in the bowel or mesentery. 2. No liver metastasis. 3. No adenopathy. 4. Calcified lesion within the uterus likely represent leiomyomas.   Electronically Signed   By: Suzy Bouchard M.D.   On: 09/01/2014 18:33      ASSESSMENT & PLAN:  Neuroendocrine tumor She has multiple nonspecific complaints.  Thankfully, repeat CT scan of the abdomen and chest show no evidence of cancer.  she is a long-term cancer survivor. I will discharge her from the clinic and no further follow-up is needed for neuroendocrine cancer   Mass of right side of neck  She has a very peculiar knot on the right side of the neck that could be related to a deviated trachea. I see a goiter on the left side of the thyroid gland. I recommend ENT consult and she agreed.    Orders Placed This Encounter  Procedures  . Ambulatory referral to ENT    Referral Priority:  Routine    Referral Type:  Consultation    Referral Reason:  Specialty Services Required    Referred to Provider:  Rozetta Nunnery, MD    Requested Specialty:  Otolaryngology    Number of Visits Requested:  1   All questions were answered. The patient knows to call the clinic with any problems, questions or concerns. No barriers to learning was detected. I spent 15 minutes counseling the patient face to face. The total time spent in the appointment was 20 minutes and more than 50% was on counseling and review of test results     [facility], [place], MD 09/02/2014 1:01 PM

## 2014-09-02 NOTE — Assessment & Plan Note (Signed)
She has a very peculiar knot on the right side of the neck that could be related to a deviated trachea. I see a goiter on the left side of the thyroid gland. I recommend ENT consult and she agreed.

## 2014-09-02 NOTE — Telephone Encounter (Signed)
Lm with answering service....they will call me and pt with appts...printed avs for pt

## 2014-09-02 NOTE — Assessment & Plan Note (Signed)
She has multiple nonspecific complaints.  Thankfully, repeat CT scan of the abdomen and chest show no evidence of cancer.  she is a long-term cancer survivor. I will discharge her from the clinic and no further follow-up is needed for neuroendocrine cancer

## 2015-01-02 ENCOUNTER — Other Ambulatory Visit: Payer: Self-pay

## 2015-01-02 DIAGNOSIS — Z1231 Encounter for screening mammogram for malignant neoplasm of breast: Secondary | ICD-10-CM

## 2015-03-02 ENCOUNTER — Other Ambulatory Visit: Payer: Self-pay | Admitting: Obstetrics and Gynecology

## 2015-03-02 DIAGNOSIS — N6320 Unspecified lump in the left breast, unspecified quadrant: Secondary | ICD-10-CM

## 2015-03-17 ENCOUNTER — Other Ambulatory Visit: Payer: BLUE CROSS/BLUE SHIELD

## 2015-03-31 ENCOUNTER — Ambulatory Visit
Admission: RE | Admit: 2015-03-31 | Discharge: 2015-03-31 | Disposition: A | Discharge status: Home / Self Care | Payer: BLUE CROSS/BLUE SHIELD | Source: Ambulatory Visit | Attending: Obstetrics and Gynecology | Admitting: Obstetrics and Gynecology

## 2015-03-31 ENCOUNTER — Ambulatory Visit
Admission: RE | Admit: 2015-03-31 | Discharge: 2015-03-31 | Disposition: A | Discharge status: Home / Self Care | Payer: 59 | Source: Ambulatory Visit | Attending: Obstetrics and Gynecology | Admitting: Obstetrics and Gynecology

## 2015-03-31 DIAGNOSIS — N6320 Unspecified lump in the left breast, unspecified quadrant: Secondary | ICD-10-CM

## 2015-08-31 ENCOUNTER — Other Ambulatory Visit: Payer: Self-pay | Admitting: Nurse Practitioner

## 2015-08-31 ENCOUNTER — Ambulatory Visit
Admission: RE | Admit: 2015-08-31 | Discharge: 2015-08-31 | Disposition: A | Discharge status: Home / Self Care | Payer: BLUE CROSS/BLUE SHIELD | Source: Ambulatory Visit | Attending: Nurse Practitioner | Admitting: Nurse Practitioner

## 2015-08-31 DIAGNOSIS — R1011 Right upper quadrant pain: Secondary | ICD-10-CM

## 2015-09-04 ENCOUNTER — Other Ambulatory Visit: Payer: Self-pay | Admitting: Gastroenterology

## 2015-09-04 DIAGNOSIS — R1011 Right upper quadrant pain: Secondary | ICD-10-CM

## 2015-09-08 ENCOUNTER — Ambulatory Visit
Admission: RE | Admit: 2015-09-08 | Discharge: 2015-09-08 | Disposition: A | Discharge status: Home / Self Care | Payer: BLUE CROSS/BLUE SHIELD | Source: Ambulatory Visit | Attending: Gastroenterology | Admitting: Gastroenterology

## 2015-09-08 DIAGNOSIS — R1011 Right upper quadrant pain: Secondary | ICD-10-CM

## 2015-09-08 MED ORDER — IOPAMIDOL (ISOVUE-300) INJECTION 61%
100.0000 mL | Freq: Once | INTRAVENOUS | Status: AC | PRN
  Administered 2015-09-08: 100 mL via INTRAVENOUS

## 2016-03-14 ENCOUNTER — Ambulatory Visit (INDEPENDENT_AMBULATORY_CARE_PROVIDER_SITE_OTHER): Payer: BLUE CROSS/BLUE SHIELD | Admitting: Orthopaedic Surgery

## 2016-03-14 ENCOUNTER — Other Ambulatory Visit: Payer: Self-pay | Admitting: Obstetrics and Gynecology

## 2016-03-14 ENCOUNTER — Ambulatory Visit (INDEPENDENT_AMBULATORY_CARE_PROVIDER_SITE_OTHER): Payer: BLUE CROSS/BLUE SHIELD

## 2016-03-14 ENCOUNTER — Ambulatory Visit (INDEPENDENT_AMBULATORY_CARE_PROVIDER_SITE_OTHER): Payer: Self-pay

## 2016-03-14 DIAGNOSIS — M25561 Pain in right knee: Secondary | ICD-10-CM | POA: Diagnosis not present

## 2016-03-14 DIAGNOSIS — G8929 Other chronic pain: Secondary | ICD-10-CM

## 2016-03-14 DIAGNOSIS — M545 Low back pain, unspecified: Secondary | ICD-10-CM

## 2016-03-14 DIAGNOSIS — Z1231 Encounter for screening mammogram for malignant neoplasm of breast: Secondary | ICD-10-CM

## 2016-03-14 DIAGNOSIS — M25562 Pain in left knee: Secondary | ICD-10-CM

## 2016-03-14 DIAGNOSIS — M542 Cervicalgia: Secondary | ICD-10-CM

## 2016-03-14 MED ORDER — METHYLPREDNISOLONE 4 MG PO TABS: ORAL_TABLET | ORAL | 0 refills | Status: DC

## 2016-03-14 NOTE — Progress Notes (Signed)
Office Visit Note   Patient: Samantha Martinez           Date of Birth: Dec 12, 1950           MRN: [id number] Visit Date: 03/14/2016              Requested by: Seward Carol, MD [address] [place], [place] [address] PCP: Kandice Hams, MD   Assessment & Plan: Visit Diagnoses:  1. Acute bilateral low back pain without sciatica   2. Neck pain   3. Chronic pain of right knee   4. Acute pain of left knee     Plan: She was deathly benefit from a course of physical therapy to work on her neck and her back as well as posture and bilateral quadriceps strengthening. We'll send her to physical therapy and I'll see her back in a month since she doing overall. Also will have her try Tumeric  Follow-Up Instructions: Return in about 4 weeks (around 04/11/2016).   Orders:  Orders Placed This Encounter  Procedures  . XR Lumbar Spine 2-3 Views  . XR Cervical Spine 2 or 3 views  . XR Knee 1-2 Views Right  . XR Knee 1-2 Views Left   Meds ordered this encounter  Medications  . methylPREDNISolone (MEDROL) 4 MG tablet    Sig: Medrol dose pack. Take as instructed    Dispense:  21 tablet    Refill:  0      Procedures: No procedures performed   Clinical Data: No additional findings.   Subjective: Chief Complaint  Patient presents with  . Lower Back - Pain    Patient states that she has bilateral knee, neck, and back pain. States she has had this for quite sometime. Really worsening. Just recently lost her step son  . Neck - Pain  . Left Knee - Pain  . 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 She has no stress with her work. She's been having slowly worsening neck pain low back pain and bilateral knee pain with a lot of cracking in her knees as well. She says she is most frustrated by the fact that her posture is been slowly getting worse and she's been having trouble just getting going and getting up from a  seated position and feeling like she is much older than she actually is. Review of Systems  She denies any chest pain, headache, fever, chills, nausea, vomiting or shortness of breath Objective: Vital Signs: There were no vitals taken for this visit.  Physical Exam She is a very pleasant individual who is alert and oriented 3 in no acute distress Ortho Exam She is very stiff and gets up on the exam table slowly. She has full range of motion of her cervical spine and her thoracic and lumbar spines however the very stiff for her. They're also painful to paraspinal muscles of the lumbar spine and the cervical spine. She is very tight hamstrings and when she bends a retractor Doshi barely touch her toes. Both knees have profound patellofemoral crepitation but of both leg was a stable with no effusion. Both have good range of motion but are stiff as well. She has weak quadriceps bilaterally. Her bilateral upper extremity exam shows no motor or sensory deficits at all with excellent strength. Specialty Comments:  No specialty comments available.  Imaging: Xr Knee 1-2 Views Left  Result Date: 03/14/2016 AP  and lateral of the left knee shows patellofemoral arthritic changes but otherwise well maintained medial lateral compartments. There is no effusion in no acute bony irregularities.  Xr Cervical Spine 2 Or 3 Views  Result Date: 03/14/2016 2 views of her cervical spine shows severe degenerative disc disease (tear osteophytes at several levels with minimal cervical spine  Xr Knee 1-2 Views Right  Result Date: 03/14/2016 2 views of her right knee shows a well maintained medial and lateral compartments. There is significant patellofemoral arthritic changes. There is good alignment overall. There is no acute bony irregularities and no effusion.  Xr Lumbar Spine 2-3 Views  Result Date: 03/14/2016 2 views of the lumbar spine show good alignment overall with minimal arthritic changes and well  maintained disc height.    PMFS History: Patient Active Problem List   Diagnosis Date Noted  . Vertigo 08/19/2014  . Anxiety disorder 08/19/2014  . Hot flashes 08/19/2014  . Mass of right side of neck 06/17/2014  . right lower back mass 06/30/2012  . Neuroendocrine tumor 11/19/2011   Past Medical History:  Diagnosis Date  . Goiter   . Malignant carcinoid tumor of the ileum     Family History  Problem Relation Age of Onset  . Cancer Mother     colon cancer    Past Surgical History:  Procedure Laterality Date  . APPENDECTOMY    . COLPOSCOPY    . OVARIAN CYST REMOVAL    . THYROIDECTOMY     Social History   Occupational History  . Not on file.   Social History Main Topics  . Smoking status: Never Smoker  . Smokeless tobacco: Never Used  . Alcohol use No  . Drug use: No  . Sexual activity: Not on file

## 2016-03-28 ENCOUNTER — Ambulatory Visit: Payer: 59 | Attending: Orthopaedic Surgery | Admitting: Physical Therapy

## 2016-03-28 DIAGNOSIS — R293 Abnormal posture: Secondary | ICD-10-CM | POA: Insufficient documentation

## 2016-03-28 DIAGNOSIS — M542 Cervicalgia: Secondary | ICD-10-CM | POA: Insufficient documentation

## 2016-03-28 NOTE — Patient Instructions (Signed)
Axial Extension (Chin Tuck)    Pull chin in and lengthen back of neck. Hold __3-5__ seconds while counting out loud. Repeat __15__ times. Do _2-3___ sessions per day.    Scapular Retraction (Standing)    With arms at sides, pinch shoulder blades together. Repeat __15__ times per set. Do __2-3__ sets per session.      NECK TENSION: Assisted Stretch    Reach right arm around head and hold slightly above ear. Gently bring right ear toward right shoulder. Hold position for _30__ breaths. Repeat with other arm. Repeat __3_ times, alternating arms. Do _2-3__ times per day.

## 2016-03-28 NOTE — Therapy (Signed)
[facility] [address]  [place], [place], [address] Phone: [phone]   Fax:  [phone]  Physical Therapy Evaluation  Patient Details  Name: NATEESHA KORIN MRN: [id number] Date of Birth: October 13, 1950 Referring Provider: Dr. Kathrynn Speed  Encounter Date: 03/28/2016      PT End of Session - 03/28/16 1802    Visit Number 1   Number of Visits 16   Date for PT Re-Evaluation 05/23/16   PT Start Time 1709   PT Stop Time 1744   PT Time Calculation (min) 35 min   Activity Tolerance Patient tolerated treatment well   Behavior During Therapy [facility] for tasks assessed/performed      Past Medical History:  Diagnosis Date  . Goiter   . Malignant carcinoid tumor of the ileum     Past Surgical History:  Procedure Laterality Date  . APPENDECTOMY    . COLPOSCOPY    . OVARIAN CYST REMOVAL    . THYROIDECTOMY      There were no vitals filed for this visit.       Subjective Assessment - 03/28/16 1710    Subjective Patient with reports of "degenerative bone disease" in neck. Patinet reporting pain and pressure along midline and lateral neck. Has difficulty getting up from supine position - has to hold head. Has tried improving ergonomics at desk at work - seems to be helping a little. Has been having this issue for a while. No numbness/tingling into UE. Reports she has been under a lot of stress recently. Tried steroids which helped while she was taking them - but now pain is back. Patient reporitng she does not sleep well and has tried multiple pillows for comfort. Uses heating pad - temporary relief.    Diagnostic tests Xray - degenerative changes   Patient Stated Goals Alleviate pain, sleep comfortably   Currently in Pain? Yes   Pain Score 6    Pain Location Neck   Pain Descriptors / Indicators Aching;Tightness   Pain Type Chronic pain   Pain Onset More than a month ago   Pain Frequency Constant   Aggravating  Factors  turning head, twisting   Pain Relieving Factors heating pad, sitting still            [facility] PT Assessment - 03/28/16 1716      Assessment   Medical Diagnosis Cervicalgia with neck arthritis   Referring Provider Dr. Kathrynn Speed   Onset Date/Surgical Date --  3 months ago   Next MD Visit prn   Prior Therapy yes     Balance Screen   Has the patient fallen in the past 6 months No   Has the patient had a decrease in activity level because of a fear of falling?  No   Is the patient reluctant to leave their home because of a fear of falling?  No     Prior Function   Level of Independence Independent   Vocation Full time employment   Vocation Requirements desk work, Teaching laboratory technician   Leisure ride motorcycle, shopping, community service     Cognition   Overall Cognitive Status Within Functional Limits for tasks assessed     Observation/Other Assessments   Focus on Therapeutic Outcomes (FOTO)  Neck: 39 (61% limited, predicted 46% limited)     Posture/Postural Control   Posture/Postural Control Postural limitations   Postural Limitations Rounded Shoulders;Forward head     ROM / Strength   AROM / PROM /  Strength AROM;PROM;Strength     AROM   Overall AROM Comments pain with all active neck movements   AROM Assessment Site Cervical   Cervical Flexion 12   Cervical Extension 25   Cervical - Right Side Bend 18   Cervical - Left Side Bend 19   Cervical - Right Rotation 39   Cervical - Left Rotation 35     PROM   Overall PROM Comments some tightness with B lateral flexion with pain produced, chin to chest with passive flexion - minor pain, full rotation B     Strength   Overall Strength Within functional limits for tasks performed   Overall Strength Comments B UE     Palpation   Palpation comment tightness in B cervical paraspinals, suboccipital mm, B UT, B levator scap, as well as B SCM musculature with tenderness to deep palpation in all areas                            PT Education - 03/28/16 1802    Education provided Yes   Education Details exam findings, POC, initial HEP   Person(s) Educated Patient   Methods Explanation;Demonstration;Handout   Comprehension Verbalized understanding;Returned demonstration;Need further instruction             PT Long Term Goals - 03/28/16 1810      PT LONG TERM GOAL #1   Title patient to be independent with HEP regarding posture, cervical mm stretching (05/23/16)   Status New     PT LONG TERM GOAL #2   Title Patient to demonstrate ability to achieve and maintain proper neutral posture for reduced stress placed on upper back/neck musculature (05/23/16)   Status New     PT LONG TERM GOAL #3   Title Patient to report ability to sleep for >5 hours without pain waking her (05/23/16)   Status New     PT LONG TERM GOAL #4   Title Patient to improve cervical AROM in all directions by 10 degress demonstrating an improvement in tissue quality (05/23/16)   Status New               Plan - 03/28/16 1803    Clinical Impression Statement Patient is a 65 y/o female presenting to [facility] today for moderate complexity eval regarding neck pain as primary complaint. Patient with referral to skilled PT to address neck pain, back pain, B hamstring tightness, as well as B patella-femoral OA. Discussion with patient prior to initialting eval to focus on one area for the best outcomes and then transitioning to another problem area with patient in full agreement. Patient today with reduced cervical AROM with pain and tightness limiting all directions. Patient also with palpable muscle tightness through B cervical paraspinals, B UT, B levator scap, B suboccipital mm, as well as B SCM with patient tender to light and deep palpation in all of the above listed areas. Patient today demonstrating poor posture with forward head and rounded shoulders likely contributing to overall pain levels.  Discussion with patient regarding sleeping with pillow only under head/neck rather than under shoulders. Patient to benefit from skilled PT intervention to address the above lsited deficits to allow for reduced head/neck pain to impove daily activites and work duties.    Rehab Potential Good   Clinical Impairments Affecting Rehab Potential Cervicalgia with neck arthritis, low back pain, B tight hamstrings, B patella-femoral knee OA   PT Frequency 2x / week  PT Duration 8 weeks   PT Treatment/Interventions ADLs/Self Care Home Management;Cryotherapy;Electrical Stimulation;Moist Heat;Ultrasound;Therapeutic exercise;Therapeutic activities;Functional mobility training;Patient/family education;Dry needling;Taping;Vasopneumatic Device;Passive range of motion;Manual techniques   PT Next Visit Plan manual work - postural education   Consulted and Agree with Plan of Care Patient      Patient will benefit from skilled therapeutic intervention in order to improve the following deficits and impairments:  Pain, Decreased range of motion, Postural dysfunction, Increased fascial restricitons, Decreased activity tolerance, Decreased mobility, Decreased strength, Impaired flexibility, Impaired tone, Impaired UE functional use  Visit Diagnosis: Cervicalgia - Plan: PT plan of care cert/re-cert  Abnormal posture - Plan: PT plan of care cert/re-cert     Problem List Patient Active Problem List   Diagnosis Date Noted  . Vertigo 08/19/2014  . Anxiety disorder 08/19/2014  . Hot flashes 08/19/2014  . Mass of right side of neck 06/17/2014  . right lower back mass 06/30/2012  . Neuroendocrine tumor 11/19/2011      Lanney Gins, PT, DPT 03/28/16 6:17 PM    [facility] [address]  [place], [place], [address] Phone: [phone]   Fax:  [phone]  Name: SMRITHI NILSSON MRN: [id number] Date of Birth: [birth year]

## 2016-04-04 ENCOUNTER — Ambulatory Visit: Payer: 59

## 2016-04-04 DIAGNOSIS — R293 Abnormal posture: Secondary | ICD-10-CM

## 2016-04-04 DIAGNOSIS — M542 Cervicalgia: Secondary | ICD-10-CM

## 2016-04-04 NOTE — Therapy (Signed)
[facility] [address]  [place], [place], [address] Phone: [phone]   Fax:  [phone]  Physical Therapy Treatment  Patient Details  Name: Samantha Martinez MRN: [id number] Date of Birth: 07/03/1950 Referring Provider: Dr. Kathrynn Speed  Encounter Date: 04/04/2016      PT End of Session - 04/04/16 1623    Visit Number 2   Number of Visits 16   Date for PT Re-Evaluation 05/23/16   PT Start Time 1618   PT Stop Time 1707   PT Time Calculation (min) 49 min   Activity Tolerance Patient tolerated treatment well   Behavior During Therapy [facility] for tasks assessed/performed      Past Medical History:  Diagnosis Date  . Goiter   . Malignant carcinoid tumor of the ileum     Past Surgical History:  Procedure Laterality Date  . APPENDECTOMY    . COLPOSCOPY    . OVARIAN CYST REMOVAL    . THYROIDECTOMY      There were no vitals filed for this visit.      Subjective Assessment - 04/04/16 1620    Subjective Pt. reporting no big changes since last visit with moderate pain today.    Patient Stated Goals Alleviate pain, sleep comfortably   Currently in Pain? Yes   Pain Score 7    Pain Location Neck   Pain Orientation Right;Left   Pain Descriptors / Indicators Aching;Tightness  pulling    Pain Type Chronic pain   Pain Onset More than a month ago   Pain Frequency Constant   Aggravating Factors  looking up toward ceiling, twisting, turning head   Multiple Pain Sites No            [facility] Adult PT Treatment/Exercise - 04/04/16 1702      Neck Exercises: Seated   Neck Retraction 15 reps;5 secs     Manual Therapy   Manual Therapy Manual Traction;Myofascial release;Soft tissue mobilization   Soft tissue mobilization STM/TPR to B UT, medial scap border   Myofascial Release TPR to medial/superior scap border area   Passive ROM B cervical side bending, B cervical roration with off shoulder depression    Manual Traction Cervical manual traction ~10 dg flexion x 2 min      Neck Exercises: Stretches   Upper Trapezius Stretch 2 reps;30 seconds   Upper Trapezius Stretch Limitations with off arm anchored on table   seated    Levator Stretch 2 reps;30 seconds  with off arm anchored on table; seated    Levator Stretch Limitations Seated with off arm anchored on table           PT Long Term Goals - 04/04/16 1626      PT LONG TERM GOAL #1   Title patient to be independent with HEP regarding posture, cervical mm stretching (05/23/16)   Status On-going     PT LONG TERM GOAL #2   Title Patient to demonstrate ability to achieve and maintain proper neutral posture for reduced stress placed on upper back/neck musculature (05/23/16)   Status On-going     PT LONG TERM GOAL #3   Title Patient to report ability to sleep for >5 hours without pain waking her (05/23/16)   Status On-going     PT LONG TERM GOAL #4   Title Patient to improve cervical AROM in all directions by 10 degress demonstrating an improvement in tissue quality (05/23/16)   Status On-going  Plan - 04/04/16 1623    Clinical Impression Statement Pt. tolerated STM/TPR to B UT and medial scapular border well today reporting decreased pain and tightness following this.  Manual cervical traction initiated today with gentle side bending and cervical rotation stretch to pt. tolerance.  Pt. very guarded with all cervical motions.  E-stim/moist heat combo performed to conclude manual stretching with pt. reporting a fall in pain from 7/10 initial pain to pain free.  Pt. requiring some cueing for proper technique with HEP performance today however good overall.  Pt responding very well to manual focused treatment today and seems to be progressing well.   PT Treatment/Interventions ADLs/Self Care Home Management;Cryotherapy;Electrical Stimulation;Moist Heat;Ultrasound;Therapeutic exercise;Therapeutic activities;Functional mobility  training;Patient/family education;Dry needling;Taping;Vasopneumatic Device;Passive range of motion;Manual techniques   PT Next Visit Plan manual work - postural education      Patient will benefit from skilled therapeutic intervention in order to improve the following deficits and impairments:  Pain, Decreased range of motion, Postural dysfunction, Increased fascial restricitons, Decreased activity tolerance, Decreased mobility, Decreased strength, Impaired flexibility, Impaired tone, Impaired UE functional use  Visit Diagnosis: Cervicalgia  Abnormal posture     Problem List Patient Active Problem List   Diagnosis Date Noted  . Vertigo 08/19/2014  . Anxiety disorder 08/19/2014  . Hot flashes 08/19/2014  . Mass of right side of neck 06/17/2014  . right lower back mass 06/30/2012  . Neuroendocrine tumor 11/19/2011    Bess Harvest, PTA 04/04/16 5:25 PM  [facility] [address]  [place], [place], [address] Phone: [phone]   Fax:  [phone]  Name: Samantha Martinez MRN: [id number] Date of Birth: 1950-05-17

## 2016-04-10 ENCOUNTER — Ambulatory Visit
Admission: RE | Admit: 2016-04-10 | Discharge: 2016-04-10 | Disposition: A | Discharge status: Home / Self Care | Payer: BLUE CROSS/BLUE SHIELD | Source: Ambulatory Visit | Attending: Obstetrics and Gynecology | Admitting: Obstetrics and Gynecology

## 2016-04-10 DIAGNOSIS — Z1231 Encounter for screening mammogram for malignant neoplasm of breast: Secondary | ICD-10-CM

## 2016-04-11 ENCOUNTER — Ambulatory Visit: Payer: 59

## 2016-04-11 DIAGNOSIS — R293 Abnormal posture: Secondary | ICD-10-CM

## 2016-04-11 DIAGNOSIS — M542 Cervicalgia: Secondary | ICD-10-CM | POA: Diagnosis not present

## 2016-04-11 NOTE — Therapy (Signed)
[facility] [address]  [place], [place], [address] Phone: [phone]   Fax:  [phone]  Physical Therapy Treatment  Patient Details  Name: Samantha Martinez MRN: [id number] Date of Birth: 07-Jul-1950 Referring Provider: Dr. Kathrynn Speed  Encounter Date: 04/11/2016      PT End of Session - 04/11/16 1716    Visit Number 3   Number of Visits 16   Date for PT Re-Evaluation 05/23/16   PT Start Time [id number]   PT Stop Time 1802   PT Time Calculation (min) 55 min   Activity Tolerance Patient tolerated treatment well   Behavior During Therapy [facility] for tasks assessed/performed      Past Medical History:  Diagnosis Date  . Goiter   . Malignant carcinoid tumor of the ileum     Past Surgical History:  Procedure Laterality Date  . APPENDECTOMY    . COLPOSCOPY    . OVARIAN CYST REMOVAL    . THYROIDECTOMY      There were no vitals filed for this visit.      Subjective Assessment - 04/11/16 1712    Subjective Pt. reporting neck/B upper shoulders have been nearly constant 7/10 pain over holiday since last treatment   Patient Stated Goals Alleviate pain, sleep comfortably   Currently in Pain? Yes   Pain Score 7    Pain Orientation Right   Pain Descriptors / Indicators Aching;Tightness   Pain Type Chronic pain   Pain Onset More than a month ago   Pain Frequency Constant   Multiple Pain Sites No            [facility] Adult PT Treatment/Exercise - 04/11/16 1749      Moist Heat Therapy   Number Minutes Moist Heat 10 Minutes   Moist Heat Location Shoulder  mid thoracic spine; and cervical spine      Electrical Stimulation   Electrical Stimulation Location T B UT    Electrical Stimulation Action to pt. tolerance   Electrical Stimulation Parameters 80-150Hz    Electrical Stimulation Goals Pain     Manual Therapy   Manual Therapy Manual Traction;Myofascial release;Soft tissue mobilization   Soft tissue  mobilization STM/TPR to B UT, medial scap border   Myofascial Release TPR to medial/superior scap border area   Passive ROM B cervical side bending, B cervical roration with off shoulder depression    Manual Traction Cervical manual traction ~10 dg flexion x 2 min      Neck Exercises: Stretches   Upper Trapezius Stretch 2 reps;30 seconds   Upper Trapezius Stretch Limitations with therapist overpressure and arm anchored    Levator Stretch 2 reps;30 seconds   Levator Stretch Limitations with therapist overpressure nad off arm anchored             PT Long Term Goals - 04/04/16 1626      PT LONG TERM GOAL #1   Title patient to be independent with HEP regarding posture, cervical mm stretching (05/23/16)   Status On-going     PT LONG TERM GOAL #2   Title Patient to demonstrate ability to achieve and maintain proper neutral posture for reduced stress placed on upper back/neck musculature (05/23/16)   Status On-going     PT LONG TERM GOAL #3   Title Patient to report ability to sleep for >5 hours without pain waking her (05/23/16)   Status On-going     PT LONG TERM GOAL #4   Title  Patient to improve cervical AROM in all directions by 10 degress demonstrating an improvement in tissue quality (05/23/16)   Status On-going               Plan - 04/11/16 1716    Clinical Impression Statement Pt. with good response from manual stretching and STM to B UT and neck today.  Manual cervical traction and PROM stretch continued today in all motions with good response.  Pt. reporting good pain decrease following E-stim/moist heat combo applied to B UT last treatment lasting 2 days, thus this continued today. Pt. able to leave treatment pain free and reports she has been performing HEP stretches at home.  Pt. seems to be progressing well at this point.     PT Treatment/Interventions ADLs/Self Care Home Management;Cryotherapy;Electrical Stimulation;Moist Heat;Ultrasound;Therapeutic exercise;Therapeutic  activities;Functional mobility training;Patient/family education;Dry needling;Taping;Vasopneumatic Device;Passive range of motion;Manual techniques   PT Next Visit Plan manual work - postural education      Patient will benefit from skilled therapeutic intervention in order to improve the following deficits and impairments:  Pain, Decreased range of motion, Postural dysfunction, Increased fascial restricitons, Decreased activity tolerance, Decreased mobility, Decreased strength, Impaired flexibility, Impaired tone, Impaired UE functional use  Visit Diagnosis: Cervicalgia  Abnormal posture     Problem List Patient Active Problem List   Diagnosis Date Noted  . Vertigo 08/19/2014  . Anxiety disorder 08/19/2014  . Hot flashes 08/19/2014  . Mass of right side of neck 06/17/2014  . right lower back mass 06/30/2012  . Neuroendocrine tumor 11/19/2011    Bess Harvest, PTA 04/11/16 6:04 PM  [facility] [address]  [place], [place], [address] Phone: [phone]   Fax:  [phone]  Name: Samantha Martinez MRN: [id number] Date of Birth: 10/15/1950

## 2016-04-17 ENCOUNTER — Ambulatory Visit: Payer: BLUE CROSS/BLUE SHIELD | Attending: Orthopaedic Surgery

## 2016-04-17 DIAGNOSIS — R293 Abnormal posture: Secondary | ICD-10-CM | POA: Insufficient documentation

## 2016-04-17 DIAGNOSIS — M542 Cervicalgia: Secondary | ICD-10-CM | POA: Insufficient documentation

## 2016-04-17 NOTE — Therapy (Signed)
[facility] [address]  [place], [place], [address] Phone: [phone]   Fax:  [phone]  Physical Therapy Treatment  Patient Details  Name: Samantha Martinez MRN: [id number] Date of Birth: [birth year] Referring Provider: Dr. Kathrynn Speed  Encounter Date: 04/17/2016      PT End of Session - 04/17/16 1711    Visit Number 4   Number of Visits 16   Date for PT Re-Evaluation 05/23/16   PT Start Time 1706  Pt. arriving late   PT Stop Time 1757   PT Time Calculation (min) 51 min   Activity Tolerance Patient tolerated treatment well   Behavior During Therapy [facility] for tasks assessed/performed      Past Medical History:  Diagnosis Date  . Goiter   . Malignant carcinoid tumor of the ileum     Past Surgical History:  Procedure Laterality Date  . APPENDECTOMY    . COLPOSCOPY    . OVARIAN CYST REMOVAL    . THYROIDECTOMY      There were no vitals filed for this visit.      Subjective Assessment - 04/17/16 1707    Subjective Pt. reporting her neck pain has been somewhat better over New Years.  Pt. reporting she is pain free when she sits straight up without bending.     Patient Stated Goals Alleviate pain, sleep comfortably   Currently in Pain? No/denies   Pain Score 0-No pain   Multiple Pain Sites No           [facility] Adult PT Treatment/Exercise - 04/17/16 1728      Neck Exercises: Machines for Strengthening   UBE (Upper Arm Bike) UBE; level 3.0, 3 min each way     Neck Exercises: Theraband   Shoulder External Rotation 15 reps;Red  5" hold    Shoulder External Rotation Limitations with red TB leaning next to doorseal; added to HEP     Neck Exercises: Standing   Neck Retraction 10 reps  5" hold   Neck Retraction Limitations on doorseal     Moist Heat Therapy   Number Minutes Moist Heat 10 Minutes   Moist Heat Location Shoulder  mid thoracic; cervical      Electrical Stimulation   Electrical Stimulation Location T B UT    Electrical Stimulation Action to pt. tolerance    Electrical Stimulation Parameters 80-150 Hz   Electrical Stimulation Goals Pain     Manual Therapy   Manual Therapy Manual Traction;Myofascial release;Soft tissue mobilization   Soft tissue mobilization STM/TPR to B UT, medial scap border   Myofascial Release TPR to medial/superior scap border area   Passive ROM B cervical side bending, B cervical roration with off shoulder depression      Neck Exercises: Stretches   Upper Trapezius Stretch 2 reps;30 seconds   Upper Trapezius Stretch Limitations with therapist overpressure and arm anchored    Levator Stretch 2 reps;30 seconds   Levator Stretch Limitations with therapist overpressure nad off arm anchored                 PT Education - 04/17/16 1736    Education provided Yes   Education Details B shoulder ER/retraction with red TB issued to pt., B UT stretch, B levator scap stretch    Person(s) Educated Patient   Methods Explanation;Demonstration;Handout;Verbal cues   Comprehension Verbalized understanding;Returned demonstration;Verbal cues required;Need further instruction  PT Long Term Goals - 04/17/16 1756      PT LONG TERM GOAL #1   Title patient to be independent with HEP regarding posture, cervical mm stretching (05/23/16)   Status On-going     PT LONG TERM GOAL #2   Title Patient to demonstrate ability to achieve and maintain proper neutral posture for reduced stress placed on upper back/neck musculature (05/23/16)   Status On-going  1.3.18: pt. reporting being more aware of posture while      PT LONG TERM GOAL #3   Title Patient to report ability to sleep for >5 hours without pain waking her (05/23/16)   Status On-going  1.3.18: Pt. still reporting waking up with neck pain while changing positions 4-5 times/night      PT LONG TERM GOAL #4   Title Patient to improve cervical AROM in all directions by 10  degress demonstrating an improvement in tissue quality (05/23/16)   Status On-going               Plan - 04/17/16 1712    Clinical Impression Statement Manual stretching, cervical traction, and STM/TRP continued today to B UT/cervical area.  Pt. with good response to this reporting she has had significantly less pain over holiday break and neck pain is now positionally dependent with most pain with side bending.  Pt. able to tolerate initiation of gentle scapular strengthening today without issue.  Conservative scapular strengthening added to HEP today via handout.  Pt. reporting good relief from E-stim/moist heat combo with relief last for rest of night following last treatment.  E-stim/moist heat continued today to conclude treatment with good response.  Pt. seems to be progressing well at this point.     PT Treatment/Interventions ADLs/Self Care Home Management;Cryotherapy;Electrical Stimulation;Moist Heat;Ultrasound;Therapeutic exercise;Therapeutic activities;Functional mobility training;Patient/family education;Dry needling;Taping;Vasopneumatic Device;Passive range of motion;Manual techniques   PT Next Visit Plan manual work - postural education      Patient will benefit from skilled therapeutic intervention in order to improve the following deficits and impairments:  Pain, Decreased range of motion, Postural dysfunction, Increased fascial restricitons, Decreased activity tolerance, Decreased mobility, Decreased strength, Impaired flexibility, Impaired tone, Impaired UE functional use  Visit Diagnosis: Cervicalgia  Abnormal posture     Problem List Patient Active Problem List   Diagnosis Date Noted  . Vertigo 08/19/2014  . Anxiety disorder 08/19/2014  . Hot flashes 08/19/2014  . Mass of right side of neck 06/17/2014  . right lower back mass 06/30/2012  . Neuroendocrine tumor 11/19/2011    Bess Harvest, PTA 04/17/16 6:04 PM  [facility] [address]  [place], [place], [address] Phone: [phone]   Fax:  [phone]  Name: Samantha Martinez MRN: [id number] Date of Birth: 1950-05-16

## 2016-04-18 ENCOUNTER — Ambulatory Visit (INDEPENDENT_AMBULATORY_CARE_PROVIDER_SITE_OTHER): Payer: BLUE CROSS/BLUE SHIELD | Admitting: Orthopaedic Surgery

## 2016-04-18 DIAGNOSIS — M542 Cervicalgia: Secondary | ICD-10-CM

## 2016-04-18 MED ORDER — CELECOXIB 200 MG PO CAPS: 200.0000 mg | ORAL_CAPSULE | Freq: Two times a day (BID) | ORAL | 1 refills | Status: DC | PRN

## 2016-04-18 MED ORDER — METHOCARBAMOL 500 MG PO TABS: 500.0000 mg | ORAL_TABLET | Freq: Four times a day (QID) | ORAL | 0 refills | Status: DC | PRN

## 2016-04-18 NOTE — Progress Notes (Signed)
The patient is returning after follow-up with physical therapy to work on her posture and her neck. She is 66 years old and works a Teaching laboratory technician all day long she gets a lot of neck pain and tension across her shoulders. She said that the physical therapist helped quite a bit with her posture and with deep tissue massage and electrostimulation helped quite a bit she still has pain though.  On examination she has full range of motion of her neck. She does have active spasming in the paraspinal muscles in the back of her shoulders but otherwise has good range of motion of everything. She has excellent strength in her bilateral upper and lower extremities and no motor or sensory dysfunction.  I do still feel it is related to posture as she works at a computer. At this point we'll try methocarbamol and Celebrex with both these being as needed. She'll continue her home exercise program and work on her posture. She'll follow-up as needed.

## 2016-04-22 ENCOUNTER — Ambulatory Visit: Payer: BLUE CROSS/BLUE SHIELD | Admitting: Physical Therapy

## 2016-04-24 ENCOUNTER — Ambulatory Visit: Payer: BLUE CROSS/BLUE SHIELD

## 2016-04-24 DIAGNOSIS — M542 Cervicalgia: Secondary | ICD-10-CM

## 2016-04-24 DIAGNOSIS — R293 Abnormal posture: Secondary | ICD-10-CM

## 2016-04-24 NOTE — Therapy (Signed)
[facility] [address]  [place], [place], [address] Phone: [phone]   Fax:  [phone]  Physical Therapy Treatment  Patient Details  Name: Samantha Martinez MRN: [id number] Date of Birth: 1951-02-13 Referring Provider: Dr. Kathrynn Speed  Encounter Date: 04/24/2016      PT End of Session - 04/24/16 1710    Visit Number 5   Number of Visits 16   Date for PT Re-Evaluation 05/23/16   PT Start Time [id number]  pt. arrived late    PT Stop Time 1806   PT Time Calculation (min) 62 min   Activity Tolerance Patient tolerated treatment well   Behavior During Therapy [facility] for tasks assessed/performed      Past Medical History:  Diagnosis Date  . Goiter   . Malignant carcinoid tumor of the ileum     Past Surgical History:  Procedure Laterality Date  . APPENDECTOMY    . COLPOSCOPY    . OVARIAN CYST REMOVAL    . THYROIDECTOMY      There were no vitals filed for this visit.      Subjective Assessment - 04/24/16 1707    Subjective Pt. reporting she feels that she wakes up with neck pain twice/night at this point with neck pain.   Patient Stated Goals Alleviate pain, sleep comfortably   Currently in Pain? Yes   Pain Score 2    Pain Location Neck   Pain Orientation Right   Pain Descriptors / Indicators Aching;Tightness   Pain Type Chronic pain   Pain Onset More than a month ago   Pain Frequency Constant   Multiple Pain Sites No           [facility] Adult PT Treatment/Exercise - 04/24/16 1711      Neck Exercises: Machines for Strengthening   UBE (Upper Arm Bike) UBE; level 3.0, 3 min each way     Neck Exercises: Theraband   Shoulder External Rotation 15 reps;Green   Shoulder External Rotation Limitations seated      Modalities   Modalities Moist Heat;Electrical Stimulation     Moist Heat Therapy   Number Minutes Moist Heat 15 Minutes   Moist Heat Location Shoulder  cervical/thoracic       Electrical Stimulation   Electrical Stimulation Location To L UT    Electrical Stimulation Action IFC; intensity to pt. tolerance      Manual Therapy   Manual Therapy Manual Traction;Passive ROM   Passive ROM cervical traction with B cervical side bending, B cervical rotation stretch x 2 min each way   off shoulder depression provided      Neck Exercises: Stretches   Upper Trapezius Stretch 2 reps;30 seconds   Upper Trapezius Stretch Limitations with therapist overpressure and arm anchored    Levator Stretch 2 reps;30 seconds   Levator Stretch Limitations with therapist overpressure nad off arm anchored    Other Neck Stretches B scallenes stretch 2 x 30 sec                 PT Education - 04/24/16 1805    Education provided Yes   Education Details green TB    Person(s) Educated Patient   Methods Explanation;Handout;Verbal cues   Comprehension Verbalized understanding             PT Long Term Goals - 04/17/16 1756      PT LONG TERM GOAL #1   Title patient to be independent with HEP regarding  posture, cervical mm stretching (05/23/16)   Status On-going     PT LONG TERM GOAL #2   Title Patient to demonstrate ability to achieve and maintain proper neutral posture for reduced stress placed on upper back/neck musculature (05/23/16)   Status On-going  1.3.18: pt. reporting being more aware of posture while      PT LONG TERM GOAL #3   Title Patient to report ability to sleep for >5 hours without pain waking her (05/23/16)   Status On-going  1.3.18: Pt. still reporting waking up with neck pain while changing positions 4-5 times/night      PT LONG TERM GOAL #4   Title Patient to improve cervical AROM in all directions by 10 degress demonstrating an improvement in tissue quality (05/23/16)   Status On-going               Plan - 04/24/16 1710    Clinical Impression Statement Pt. reporting MD f/u went well with no changes and feels she has felt benefit from therapy to  this point.  Pt. reporting her pain has significantly decreased since starting treatment.  Pt. tolerated today's treatment focusing on manual cervical traction and manual cervical stretching well.  Moist heat applied to cervical and thoracic areas today with E-stim applied to L sided UT/neck.  Pt. still reporting good benefit from E-stim/moist heat reporting two-day relief following this.  Pt. issued green TB today to be added to scap. retraction/ER HEP activity due to this being forgotten by therapist last visit.  Pt. reporting performing HEP consistently at this point and seems to be progressing well.     PT Treatment/Interventions ADLs/Self Care Home Management;Cryotherapy;Electrical Stimulation;Moist Heat;Ultrasound;Therapeutic exercise;Therapeutic activities;Functional mobility training;Patient/family education;Dry needling;Taping;Vasopneumatic Device;Passive range of motion;Manual techniques   PT Next Visit Plan manual work - postural education      Patient will benefit from skilled therapeutic intervention in order to improve the following deficits and impairments:  Pain, Decreased range of motion, Postural dysfunction, Increased fascial restricitons, Decreased activity tolerance, Decreased mobility, Decreased strength, Impaired flexibility, Impaired tone, Impaired UE functional use  Visit Diagnosis: Cervicalgia  Abnormal posture     Problem List Patient Active Problem List   Diagnosis Date Noted  . Cervicalgia 04/18/2016  . Vertigo 08/19/2014  . Anxiety disorder 08/19/2014  . Hot flashes 08/19/2014  . Mass of right side of neck 06/17/2014  . right lower back mass 06/30/2012  . Neuroendocrine tumor 11/19/2011    Samantha Martinez, PTA 04/24/16 6:14 PM  [facility] [address]  [place], [place], [address] Phone: [phone]   Fax:  [phone]  Name: Samantha Martinez MRN: [id number] Date of Birth:  Jan 14, 1951

## 2016-04-29 ENCOUNTER — Ambulatory Visit: Payer: BLUE CROSS/BLUE SHIELD | Admitting: Physical Therapy

## 2016-05-02 ENCOUNTER — Ambulatory Visit: Payer: BLUE CROSS/BLUE SHIELD | Admitting: Physical Therapy

## 2016-05-06 ENCOUNTER — Ambulatory Visit: Payer: BLUE CROSS/BLUE SHIELD

## 2016-05-09 ENCOUNTER — Ambulatory Visit: Payer: BLUE CROSS/BLUE SHIELD | Admitting: Physical Therapy

## 2016-05-23 ENCOUNTER — Ambulatory Visit: Payer: BLUE CROSS/BLUE SHIELD | Attending: Orthopaedic Surgery | Admitting: Physical Therapy

## 2016-05-23 DIAGNOSIS — R293 Abnormal posture: Secondary | ICD-10-CM | POA: Insufficient documentation

## 2016-05-23 DIAGNOSIS — M542 Cervicalgia: Secondary | ICD-10-CM | POA: Diagnosis not present

## 2016-05-24 NOTE — Therapy (Addendum)
[facility] [address]  [place], [place], [address] Phone: [phone]   Fax:  [phone]  Physical Therapy Treatment  Patient Details  Name: Samantha Martinez MRN: [id number] Date of Birth: 02/04/1951 Referring Provider: Dr. Kathrynn Speed  Encounter Date: 05/23/2016      PT End of Session - 05/24/16 0918    Visit Number 6   Number of Visits 16   Date for PT Re-Evaluation 05/23/16   PT Start Time 1657   PT Stop Time 0347   PT Time Calculation (min) 58 min   Activity Tolerance Patient tolerated treatment well   Behavior During Therapy [facility] for tasks assessed/performed      Past Medical History:  Diagnosis Date  . Goiter   . Malignant carcinoid tumor of the ileum     Past Surgical History:  Procedure Laterality Date  . APPENDECTOMY    . COLPOSCOPY    . OVARIAN CYST REMOVAL    . THYROIDECTOMY      There were no vitals filed for this visit.      Subjective Assessment - 05/23/16 1700    Subjective feels like neck is getting a lot better - only some pain when lying supine. Wants to go on hold as she reports having trouble with her insurance company.    Diagnostic tests Xray - degenerative changes   Patient Stated Goals Alleviate pain, sleep comfortably   Currently in Pain? No/denies   Pain Score 0-No pain                         [facility] Adult PT Treatment/Exercise - 05/23/16 1850      Neck Exercises: Machines for Strengthening   UBE (Upper Arm Bike) level 3 x 6 minutes (3/3)     Neck Exercises: Theraband   Shoulder Extension 15 reps;Green   Shoulder Extension Limitations with scap squeeze - hands not going past hip line   Rows 15 reps;Green   Rows Limitations with scap squeeze   Shoulder External Rotation 15 reps;Green   Shoulder External Rotation Limitations seated with scap squeeze   Horizontal ABduction 15 reps;Green   Horizontal ABduction Limitations with scap squeeze      Neck Exercises: Seated   Neck Retraction 15 reps;5 secs     Modalities   Modalities Moist Heat;Electrical Stimulation     Moist Heat Therapy   Number Minutes Moist Heat 15 Minutes   Moist Heat Location Cervical     Electrical Stimulation   Electrical Stimulation Location cervical paraspinals to B UT   Electrical Stimulation Action IFC   Electrical Stimulation Parameters to tolerance   Electrical Stimulation Goals Pain     Manual Therapy   Manual Therapy Soft tissue mobilization;Myofascial release;Passive ROM   Soft tissue mobilization STM to B UT, B levator scap, B cervical paraspinals, B suboccipital mm.    Myofascial Release manual trigger point release to R cervical paraspinals   Passive ROM 2 x 1 minute each into cervical lateral flexion with slight overpressure at shoulder for UT stretch                     PT Long Term Goals - 05/24/16 0919      PT LONG TERM GOAL #1   Title patient to be independent with HEP regarding posture, cervical mm stretching (05/23/16)   Status Achieved     PT LONG TERM GOAL #2  Title Patient to demonstrate ability to achieve and maintain proper neutral posture for reduced stress placed on upper back/neck musculature (05/23/16)   Status Achieved     PT LONG TERM GOAL #3   Title Patient to report ability to sleep for >5 hours without pain waking her (05/23/16)   Status Achieved     PT LONG TERM GOAL #4   Title Patient to improve cervical AROM in all directions by 10 degress demonstrating an improvement in tissue quality (05/23/16)   Status Achieved  cervical ROM full and symmetrical               Plan - 05/24/16 0920    Clinical Impression Statement Samantha Martinez doing much better today. Patient reporting return of full cervical ROM, improvements in postural awareness, improved daily pain and sleeping. Patient today able to perform HEP correctly and with good adherence. Patient achieving all cervical related goals at this point and  will plan to see patient in the future for other PT related needs.    Clinical Impairments Affecting Rehab Potential Cervicalgia with neck arthritis, low back pain, B tight hamstrings, B patella-femoral knee OA   PT Treatment/Interventions ADLs/Self Care Home Management;Cryotherapy;Electrical Stimulation;Moist Heat;Ultrasound;Therapeutic exercise;Therapeutic activities;Functional mobility training;Patient/family education;Dry needling;Taping;Vasopneumatic Device;Passive range of motion;Manual techniques   PT Next Visit Plan 30 day hold   Consulted and Agree with Plan of Care Patient      Patient will benefit from skilled therapeutic intervention in order to improve the following deficits and impairments:  Pain, Decreased range of motion, Postural dysfunction, Increased fascial restricitons, Decreased activity tolerance, Decreased mobility, Decreased strength, Impaired flexibility, Impaired tone, Impaired UE functional use  Visit Diagnosis: Cervicalgia  Abnormal posture     Problem List Patient Active Problem List   Diagnosis Date Noted  . Cervicalgia 04/18/2016  . Vertigo 08/19/2014  . Anxiety disorder 08/19/2014  . Hot flashes 08/19/2014  . Mass of right side of neck 06/17/2014  . right lower back mass 06/30/2012  . Neuroendocrine tumor 11/19/2011    Lanney Gins, PT, DPT 05/24/16 9:29 AM  PHYSICAL THERAPY DISCHARGE SUMMARY  Visits from Start of Care: 6  Current functional level related to goals / functional outcomes: See above   Remaining deficits: See above   Education / Equipment: HEP  Plan: Patient agrees to discharge.  Patient goals were met. Patient is being discharged due to meeting the stated rehab goals.  ?????     Lanney Gins, PT, DPT 07/22/16 8:38 AM   [facility], [place], [address] Phone: [phone]   Fax:  [phone]  Name: Samantha Martinez MRN: [id number] Date of Birth: [birth year]

## 2017-03-11 ENCOUNTER — Other Ambulatory Visit: Payer: Self-pay | Admitting: Obstetrics and Gynecology

## 2017-03-11 DIAGNOSIS — Z1231 Encounter for screening mammogram for malignant neoplasm of breast: Secondary | ICD-10-CM

## 2017-04-14 ENCOUNTER — Ambulatory Visit
Admission: RE | Admit: 2017-04-14 | Discharge: 2017-04-14 | Disposition: A | Discharge status: Home / Self Care | Payer: Medicare Other | Source: Ambulatory Visit | Attending: Obstetrics and Gynecology | Admitting: Obstetrics and Gynecology

## 2017-04-14 DIAGNOSIS — Z1231 Encounter for screening mammogram for malignant neoplasm of breast: Secondary | ICD-10-CM

## 2017-08-13 ENCOUNTER — Other Ambulatory Visit: Payer: Self-pay | Admitting: Hematology and Oncology

## 2017-08-13 ENCOUNTER — Telehealth: Payer: Self-pay | Admitting: Hematology and Oncology

## 2017-08-13 ENCOUNTER — Telehealth: Payer: Self-pay

## 2017-08-13 DIAGNOSIS — D3A8 Other benign neuroendocrine tumors: Secondary | ICD-10-CM

## 2017-08-13 NOTE — Telephone Encounter (Signed)
I will place scheduling msg to see her back in about 2 weeks

## 2017-08-13 NOTE — Telephone Encounter (Signed)
Received VM from pt regarding she would like an appt and bloodwork - "it's been about 5 years" .  Called pt and she reports she has been having a "tingling pain right underneath her navel, near where had my surgery", reports going on less than 3 months and has lost 14 lbs since 07-19-2022.  Reports her husband passed away in 07/19/2022 and she's not sure if this is her nerves or not but says "I just need to get this checked out".  I let her know I would touch base with Dr Alvy Bimler and call her back.

## 2017-08-13 NOTE — Telephone Encounter (Signed)
Spoke to patient regarding upcoming  May appointments per 5/1 sch message.

## 2017-09-01 ENCOUNTER — Inpatient Hospital Stay: Payer: Medicare Other | Attending: Hematology and Oncology

## 2017-09-01 ENCOUNTER — Inpatient Hospital Stay: Payer: Medicare Other | Admitting: Hematology and Oncology

## 2017-09-01 ENCOUNTER — Encounter: Payer: Self-pay | Admitting: Hematology and Oncology

## 2017-09-01 VITALS — BP 139/78 | HR 79 | Temp 98.5°F | Resp 18 | Ht 64.0 in | Wt 172.8 lb

## 2017-09-01 DIAGNOSIS — R634 Abnormal weight loss: Secondary | ICD-10-CM | POA: Insufficient documentation

## 2017-09-01 DIAGNOSIS — D3A8 Other benign neuroendocrine tumors: Secondary | ICD-10-CM | POA: Diagnosis not present

## 2017-09-01 DIAGNOSIS — R197 Diarrhea, unspecified: Secondary | ICD-10-CM | POA: Insufficient documentation

## 2017-09-01 LAB — CBC WITH DIFFERENTIAL/PLATELET
Basophils Absolute: 0 10*3/uL (ref 0.0–0.1)
Basophils Relative: 0 %
Eosinophils Absolute: 0.1 10*3/uL (ref 0.0–0.5)
Eosinophils Relative: 2 %
HCT: 37.6 % (ref 34.8–46.6)
Hemoglobin: 12.5 g/dL (ref 11.6–15.9)
Lymphocytes Relative: 28 %
Lymphs Abs: 1.6 10*3/uL (ref 0.9–3.3)
MCH: 30.7 pg (ref 25.1–34.0)
MCHC: 33.2 g/dL (ref 31.5–36.0)
MCV: 92.5 fL (ref 79.5–101.0)
Monocytes Absolute: 0.7 10*3/uL (ref 0.1–0.9)
Monocytes Relative: 12 %
Neutro Abs: 3.4 10*3/uL (ref 1.5–6.5)
Neutrophils Relative %: 58 %
Platelets: 216 10*3/uL (ref 145–400)
RBC: 4.07 MIL/uL (ref 3.70–5.45)
RDW: 13.7 % (ref 11.2–14.5)
WBC: 5.8 10*3/uL (ref 3.9–10.3)

## 2017-09-01 LAB — COMPREHENSIVE METABOLIC PANEL
ALT: 18 U/L (ref 0–55)
AST: 19 U/L (ref 5–34)
Albumin: 3.7 g/dL (ref 3.5–5.0)
Alkaline Phosphatase: 61 U/L (ref 40–150)
Anion gap: 5 (ref 3–11)
BUN: 7 mg/dL (ref 7–26)
CO2: 29 mmol/L (ref 22–29)
Calcium: 8.8 mg/dL (ref 8.4–10.4)
Chloride: 106 mmol/L (ref 98–109)
Creatinine, Ser: 0.82 mg/dL (ref 0.60–1.10)
GFR calc Af Amer: >60 mL/min (ref >60)
GFR calc non Af Amer: >60 mL/min (ref >60)
Glucose, Bld: 107 mg/dL (ref 70–140)
Potassium: 4 mmol/L (ref 3.5–5.1)
Sodium: 140 mmol/L (ref 136–145)
Total Bilirubin: 0.3 mg/dL (ref 0.2–1.2)
Total Protein: 7.2 g/dL (ref 6.4–8.3)

## 2017-09-01 NOTE — Assessment & Plan Note (Signed)
She has significant concern for cancer recurrence given recent weight loss and changes in bowel habits with frequent diarrhea Her examination is benign but neuroendocrine tumor can certainly relapse in this fashion I recommend CT scan of the abdomen with IV contrast for evaluation and she agreed to proceed If CT scan is not revealing the cause of her symptoms, I will refer her back to Dr. Benson Norway for possible repeat endoscopy evaluation She agree with the plan of care

## 2017-09-01 NOTE — Assessment & Plan Note (Signed)
She has new onset of diarrhea of unknown etiology I recommend her to continue Imodium for now and repeat imaging study as above

## 2017-09-01 NOTE — Assessment & Plan Note (Signed)
She has no unintentional weight loss As above, I plan to repeat imaging study

## 2017-09-01 NOTE — Progress Notes (Signed)
[facility] CONSULT NOTE  Patient Care Team: Seward Carol, MD as PCP - General (Internal Medicine)  ASSESSMENT & PLAN:  Neuroendocrine tumor She has significant concern for cancer recurrence given recent weight loss and changes in bowel habits with frequent diarrhea Her examination is benign but neuroendocrine tumor can certainly relapse in this fashion I recommend CT scan of the abdomen with IV contrast for evaluation and she agreed to proceed If CT scan is not revealing the cause of her symptoms, I will refer her back to Dr. Benson Norway for possible repeat endoscopy evaluation She agree with the plan of care  Diarrhea She has new onset of diarrhea of unknown etiology I recommend her to continue Imodium for now and repeat imaging study as above  Weight loss, non-intentional She has no unintentional weight loss As above, I plan to repeat imaging study   Orders Placed This Encounter  Procedures  . CT ABDOMEN PELVIS W CONTRAST    Standing Status:   Future    Standing Expiration Date:   09/02/2018    Order Specific Question:   If indicated for the ordered procedure, I authorize the administration of contrast media per Radiology protocol    Answer:   Yes    Order Specific Question:   Preferred imaging location?    Answer:   [facility]    Order Specific Question:   Radiology Contrast Protocol - do NOT remove file path    Answer:   [email or web address]   CHIEF COMPLAINTS/PURPOSE OF CONSULTATION:  Recent diarrhea and unexplained weight loss, on background history of neuroendocrine tumor, worrisome for cancer recurrence  HISTORY OF PRESENTING ILLNESS:  Samantha Martinez 67 y.o. female is here because of new onset of symptoms. I have not seen her for over 3 years. She is referred back to see me due to recent symptoms of unintentional weight loss of 15 pounds and diarrhea These symptoms have been going on for approximately 2 months Her  diarrhea usually happen within an hour and a half of food.  It is preceded with some abdominal cramps She had undergone extensive evaluation through her primary care doctor with negative stool studies She had not have repeat endoscopy evaluation At first, she thought her weight loss was due to grieving process due to the death of her husband in 07-28-2022 of this year.  However, with the symptoms of cramping and diarrhea, she is worrisome of cancer recurrence and hence requested to be seen again. Her original diagnosis of neuroendocrine tumor was discovered incidentally from colonoscopy.  It was low-grade, near terminal ileum.    1. ILEUM AND CECUM, SEGMENTAL RESECTION: - NEUROENDOCRINE TUMOR, SPANNING 2.5 CM. - PERINEURAL INVASION IS IDENTIFIED. - LYMPHOVASCULAR INVASION IS IDENTIFIED. - THE TUMOR EXTENDS THROUGH THE MUSCULARIS PROPRIA AND INTO THE PERICOLONIC SOFT TISSUE. - METASTATIC NEUROENDOCRINE TUMOR IN 1 OF 5 LYMPH NODES (1/5). - FOCUS OF SOFT TISSUE METASTASIS.  2. LYMPH NODE, MESENTERIC, BIOPSY: - THERE IS NO EVIDENCE OF MALIGNANCY IN 1 OF 1 LYMPH NODE (0/1).  COMMENT ONCOLOGY TABLE- COLON AND RECTUM  1. Maximum tumor size (cm): 2.5 cm (gross measurement) 2. Tumor Location: Ileum 3. Histology: Neuroendocrine 4. Grade: Low to Intermediate grade 5. Margins: proximal- 3.0 cm; distal- 7.0 cm; radial- 3.5 cm 6. Treatment effect (if treated with neoadjuvant therapy): N/A 7. Perforation of visceral peritoneum: Tumor grossly extends into pericolonic soft tissue 8. Depth of invasion: Through the muscularis propria and into the surrounding pericolonic adipose tissue 9. Vascular/lymphatic invasion:  Identified 10. Lymph nodes: # examined 6; # positive 1 11. TNM code:pT4, pN1, pMX  Her last CT scan of 2017 showed no evidence of cancer recurrence  MEDICAL HISTORY:  Past Medical History:  Diagnosis Date  . Goiter   . Malignant carcinoid tumor of the  ileum [facility])     SURGICAL HISTORY: Past Surgical History:  Procedure Laterality Date  . APPENDECTOMY    . COLPOSCOPY    . OVARIAN CYST REMOVAL    . THYROIDECTOMY      SOCIAL HISTORY: Social History   Socioeconomic History  . Marital status: Widowed    Spouse name: Not on file  . Number of children: 1  . Years of education: Not on file  . Highest education level: Not on file  Occupational History  . Not on file  Social Needs  . Financial resource strain: Not on file  . Food insecurity:    Worry: Not on file    Inability: Not on file  . Transportation needs:    Medical: Not on file    Non-medical: Not on file  Tobacco Use  . Smoking status: Never Smoker  . Smokeless tobacco: Never Used  Substance and Sexual Activity  . Alcohol use: No  . Drug use: No  . Sexual activity: Not on file  Lifestyle  . Physical activity:    Days per week: Not on file    Minutes per session: Not on file  . Stress: Not on file  Relationships  . Social connections:    Talks on phone: Not on file    Gets together: Not on file    Attends religious service: Not on file    Active member of club or organization: Not on file    Attends meetings of clubs or organizations: Not on file    Relationship status: Not on file  . Intimate partner violence:    Fear of current or ex partner: Not on file    Emotionally abused: Not on file    Physically abused: Not on file    Forced sexual activity: Not on file  Other Topics Concern  . Not on file  Social History Narrative  . Not on file    FAMILY HISTORY: Family History  Problem Relation Age of Onset  . Cancer Mother        colon cancer    ALLERGIES:  is allergic to latex.  MEDICATIONS:  Current Outpatient Medications  Medication Sig Dispense Refill  . ALPRAZolam (XANAX) 0.5 MG tablet Take 0.5 mg by mouth as needed.    Marland Kitchen escitalopram (LEXAPRO) 10 MG tablet Take 10 mg by mouth daily.    . Multiple Vitamins-Minerals (MULTIVITAMIN PO) Take 1  tablet by mouth daily.     No current facility-administered medications for this visit.     REVIEW OF SYSTEMS:   Constitutional: Denies fevers, chills or abnormal night sweats Eyes: Denies blurriness of vision, double vision or watery eyes Ears, nose, mouth, throat, and face: Denies mucositis or sore throat Respiratory: Denies cough, dyspnea or wheezes Cardiovascular: Denies palpitation, chest discomfort or lower extremity swelling Skin: Denies abnormal skin rashes Lymphatics: Denies new lymphadenopathy or easy bruising Neurological:Denies numbness, tingling or new weaknesses Behavioral/Psych: Mood is stable, no new changes  All other systems were reviewed with the patient and are negative.  PHYSICAL EXAMINATION: ECOG PERFORMANCE STATUS: 1 - Symptomatic but completely ambulatory  Vitals:   09/01/17 1351  BP: 139/78  Pulse: 79  Resp: 18  Temp: 98.5  F (36.9 C)  SpO2: 100%   Filed Weights   09/01/17 1351  Weight: 172 lb 12.8 oz (78.4 kg)    GENERAL:alert, no distress and comfortable SKIN: skin color, texture, turgor are normal, no rashes or significant lesions EYES: normal, conjunctiva are pink and non-injected, sclera clear OROPHARYNX:no exudate, no erythema and lips, buccal mucosa, and tongue normal  NECK: supple, thyroid normal size, non-tender, without nodularity LYMPH:  no palpable lymphadenopathy in the cervical, axillary or inguinal LUNGS: clear to auscultation and percussion with normal breathing effort HEART: regular rate & rhythm and no murmurs and no lower extremity edema ABDOMEN:abdomen soft, non-tender and normal bowel sounds Musculoskeletal:no cyanosis of digits and no clubbing  PSYCH: alert & oriented x 3 with fluent speech NEURO: no focal motor/sensory deficits  LABORATORY DATA:  I have reviewed the data as listed Lab Results  Component Value Date   WBC 5.8 09/01/2017   HGB 12.5 09/01/2017   HCT 37.6 09/01/2017   MCV 92.5 09/01/2017   PLT 216  09/01/2017   Recent Labs    09/01/17 1313  NA 140  K 4.0  CL 106  CO2 29  GLUCOSE 107  BUN 7  CREATININE 0.82  CALCIUM 8.8  GFRNONAA >60  GFRAA >60  PROT 7.2  ALBUMIN 3.7  AST 19  ALT 18  ALKPHOS 61  BILITOT 0.3    I spent 30 minutes counseling the patient face to face. The total time spent in the appointment was 45 minutes and more than 50% was on counseling.  All questions were answered. The patient knows to call the clinic with any problems, questions or concerns.  Heath Lark, MD 09/01/2017 2:31 PM

## 2017-09-09 ENCOUNTER — Ambulatory Visit (HOSPITAL_COMMUNITY)
Admission: RE | Admit: 2017-09-09 | Discharge: 2017-09-09 | Disposition: A | Discharge status: Home / Self Care | Payer: Medicare Other | Source: Ambulatory Visit | Attending: Hematology and Oncology | Admitting: Hematology and Oncology

## 2017-09-09 DIAGNOSIS — R634 Abnormal weight loss: Secondary | ICD-10-CM | POA: Diagnosis not present

## 2017-09-09 DIAGNOSIS — D3A8 Other benign neuroendocrine tumors: Secondary | ICD-10-CM | POA: Insufficient documentation

## 2017-09-09 DIAGNOSIS — R9389 Abnormal findings on diagnostic imaging of other specified body structures: Secondary | ICD-10-CM | POA: Insufficient documentation

## 2017-09-09 DIAGNOSIS — R197 Diarrhea, unspecified: Secondary | ICD-10-CM | POA: Insufficient documentation

## 2017-09-09 MED ORDER — IOPAMIDOL (ISOVUE-300) INJECTION 61%
100.0000 mL | Freq: Once | INTRAVENOUS | Status: AC | PRN
  Administered 2017-09-09: 100 mL via INTRAVENOUS

## 2017-09-09 MED ORDER — IOPAMIDOL (ISOVUE-300) INJECTION 61%
INTRAVENOUS | Status: AC
  Filled 2017-09-09: qty 100

## 2017-09-10 ENCOUNTER — Telehealth: Payer: Self-pay | Admitting: *Deleted

## 2017-09-10 NOTE — Telephone Encounter (Signed)
Notified of message below. Verbalized understanding 

## 2017-09-10 NOTE — Telephone Encounter (Signed)
-----   Message from Heath Lark, MD sent at 09/10/2017  7:31 AM EDT ----- Regarding: normal CT Let her know CT is normal I recommend she contacts Dr. Benson Norway for colonoscopy Please fax my last notes and CT report to Dr. Benson Norway ----- Message ----- From: Interface, Rad Results In Sent: 09/09/2017   5:09 PM To: Heath Lark, MD

## 2018-01-19 ENCOUNTER — Other Ambulatory Visit: Payer: Self-pay | Admitting: Internal Medicine

## 2018-01-19 ENCOUNTER — Ambulatory Visit
Admission: RE | Admit: 2018-01-19 | Discharge: 2018-01-19 | Disposition: A | Discharge status: Home / Self Care | Payer: Medicare Other | Source: Ambulatory Visit | Attending: Internal Medicine | Admitting: Internal Medicine

## 2018-01-19 DIAGNOSIS — R198 Other specified symptoms and signs involving the digestive system and abdomen: Secondary | ICD-10-CM

## 2018-01-29 ENCOUNTER — Emergency Department (HOSPITAL_COMMUNITY): Payer: Medicare Other

## 2018-01-29 ENCOUNTER — Encounter (HOSPITAL_COMMUNITY): Payer: Self-pay

## 2018-01-29 ENCOUNTER — Observation Stay (HOSPITAL_COMMUNITY): Payer: Medicare Other

## 2018-01-29 ENCOUNTER — Observation Stay (HOSPITAL_COMMUNITY)
Admission: EM | Admit: 2018-01-29 | Discharge: 2018-01-30 | Disposition: A | Discharge status: Home / Self Care | Payer: Medicare Other | Attending: Family Medicine | Admitting: Family Medicine

## 2018-01-29 ENCOUNTER — Other Ambulatory Visit: Payer: Self-pay

## 2018-01-29 ENCOUNTER — Observation Stay (HOSPITAL_BASED_OUTPATIENT_CLINIC_OR_DEPARTMENT_OTHER): Payer: Medicare Other

## 2018-01-29 DIAGNOSIS — R42 Dizziness and giddiness: Secondary | ICD-10-CM | POA: Insufficient documentation

## 2018-01-29 DIAGNOSIS — Y92003 Bedroom of unspecified non-institutional (private) residence as the place of occurrence of the external cause: Secondary | ICD-10-CM | POA: Diagnosis not present

## 2018-01-29 DIAGNOSIS — I951 Orthostatic hypotension: Secondary | ICD-10-CM | POA: Diagnosis not present

## 2018-01-29 DIAGNOSIS — Q283 Other malformations of cerebral vessels: Secondary | ICD-10-CM | POA: Diagnosis not present

## 2018-01-29 DIAGNOSIS — R55 Syncope and collapse: Secondary | ICD-10-CM

## 2018-01-29 DIAGNOSIS — W06XXXA Fall from bed, initial encounter: Secondary | ICD-10-CM | POA: Insufficient documentation

## 2018-01-29 DIAGNOSIS — D3A8 Other benign neuroendocrine tumors: Secondary | ICD-10-CM | POA: Insufficient documentation

## 2018-01-29 DIAGNOSIS — F419 Anxiety disorder, unspecified: Secondary | ICD-10-CM | POA: Insufficient documentation

## 2018-01-29 DIAGNOSIS — S01411A Laceration without foreign body of right cheek and temporomandibular area, initial encounter: Secondary | ICD-10-CM | POA: Insufficient documentation

## 2018-01-29 DIAGNOSIS — S0181XA Laceration without foreign body of other part of head, initial encounter: Secondary | ICD-10-CM | POA: Insufficient documentation

## 2018-01-29 DIAGNOSIS — Z79899 Other long term (current) drug therapy: Secondary | ICD-10-CM | POA: Diagnosis not present

## 2018-01-29 LAB — CBC WITH DIFFERENTIAL/PLATELET
Abs Immature Granulocytes: 0.02 10*3/uL (ref 0.00–0.07)
Basophils Absolute: 0 10*3/uL (ref 0.0–0.1)
Basophils Relative: 0 %
Eosinophils Absolute: 0 10*3/uL (ref 0.0–0.5)
Eosinophils Relative: 0 %
HCT: 40.6 % (ref 36.0–46.0)
Hemoglobin: 13.1 g/dL (ref 12.0–15.0)
Immature Granulocytes: 0 %
Lymphocytes Relative: 21 %
Lymphs Abs: 1.8 10*3/uL (ref 0.7–4.0)
MCH: 30.6 pg (ref 26.0–34.0)
MCHC: 32.3 g/dL (ref 30.0–36.0)
MCV: 94.9 fL (ref 80.0–100.0)
Monocytes Absolute: 0.7 10*3/uL (ref 0.1–1.0)
Monocytes Relative: 8 %
Neutro Abs: 5.9 10*3/uL (ref 1.7–7.7)
Neutrophils Relative %: 71 %
Platelets: 238 10*3/uL (ref 150–400)
RBC: 4.28 MIL/uL (ref 3.87–5.11)
RDW: 13.2 % (ref 11.5–15.5)
WBC: 8.5 10*3/uL (ref 4.0–10.5)
nRBC: 0 % (ref 0.0–0.2)

## 2018-01-29 LAB — BASIC METABOLIC PANEL
Anion gap: 9 (ref 5–15)
BUN: 10 mg/dL (ref 8–23)
CO2: 29 mmol/L (ref 22–32)
Calcium: 9.7 mg/dL (ref 8.9–10.3)
Chloride: 105 mmol/L (ref 98–111)
Creatinine, Ser: 0.81 mg/dL (ref 0.44–1.00)
GFR calc Af Amer: >60 mL/min (ref >60)
GFR calc non Af Amer: >60 mL/min (ref >60)
Glucose, Bld: 97 mg/dL (ref 70–99)
Potassium: 3.8 mmol/L (ref 3.5–5.1)
Sodium: 143 mmol/L (ref 135–145)

## 2018-01-29 LAB — URINALYSIS, ROUTINE W REFLEX MICROSCOPIC
Bacteria, UA: NONE SEEN
Bilirubin Urine: NEGATIVE
Glucose, UA: NEGATIVE mg/dL
Ketones, ur: NEGATIVE mg/dL
Leukocytes, UA: NEGATIVE
Nitrite: NEGATIVE
Protein, ur: NEGATIVE mg/dL
Specific Gravity, Urine: 1.003 — ABNORMAL LOW (ref 1.005–1.030)
pH: 7 (ref 5.0–8.0)

## 2018-01-29 LAB — TSH: TSH: 1.124 u[IU]/mL (ref 0.350–4.500)

## 2018-01-29 LAB — TROPONIN I: Troponin I: <0.03 ng/mL (ref <0.03)

## 2018-01-29 LAB — ECHOCARDIOGRAM COMPLETE

## 2018-01-29 MED ORDER — ESCITALOPRAM OXALATE 20 MG PO TABS: 20.0000 mg | ORAL_TABLET | Freq: Every day | ORAL | Status: DC | PRN

## 2018-01-29 MED ORDER — BACITRACIN ZINC 500 UNIT/GM EX OINT
1.0000 "application " | TOPICAL_OINTMENT | Freq: Two times a day (BID) | CUTANEOUS | Status: DC
  Filled 2018-01-29: qty 0.9

## 2018-01-29 MED ORDER — GADOBUTROL 1 MMOL/ML IV SOLN
7.5000 mL | Freq: Once | INTRAVENOUS | Status: AC | PRN
  Administered 2018-01-29: 7 mL via INTRAVENOUS

## 2018-01-29 MED ORDER — ACETAMINOPHEN 325 MG PO TABS
650.0000 mg | ORAL_TABLET | Freq: Four times a day (QID) | ORAL | Status: DC | PRN
  Administered 2018-01-29 (×2): 650 mg via ORAL
  Filled 2018-01-29 (×2): qty 2

## 2018-01-29 MED ORDER — MUSCLE RUB 10-15 % EX CREA
TOPICAL_CREAM | CUTANEOUS | Status: DC | PRN
  Filled 2018-01-29: qty 85

## 2018-01-29 MED ORDER — SODIUM CHLORIDE 0.9 % IV BOLUS
500.0000 mL | Freq: Once | INTRAVENOUS | Status: AC
  Administered 2018-01-29: 500 mL via INTRAVENOUS

## 2018-01-29 MED ORDER — ACETAMINOPHEN 500 MG PO TABS
1000.0000 mg | ORAL_TABLET | Freq: Once | ORAL | Status: AC
  Administered 2018-01-29: 1000 mg via ORAL
  Filled 2018-01-29: qty 2

## 2018-01-29 MED ORDER — ENOXAPARIN SODIUM 40 MG/0.4ML ~~LOC~~ SOLN
40.0000 mg | SUBCUTANEOUS | Status: DC
  Filled 2018-01-29: qty 0.4

## 2018-01-29 NOTE — CV Procedure (Signed)
2 D echo attempted but patient would rather eat and do echo later.

## 2018-01-29 NOTE — ED Provider Notes (Signed)
[facility] DEPT Provider Note   CSN: [id number] Arrival date & time: 01/29/18  0603     History   Chief Complaint Chief Complaint  Patient presents with  . Loss of Consciousness  . Facial Laceration    HPI Samantha Martinez is a 67 y.o. female.  HPI Pleasant female presents with her sister who assists with the HPI. The patient notes that she was mildly lightheaded yesterday, but otherwise in her usual state of health. Today, upon awakening, she felt lightheaded, had one episode of near syncope, and after feeling slightly better, tried to ambulate, but had an episode of syncope, falling onto a solid object. Since that time she has had headache, facial pain, mild persistent lightheadedness. The pain is sore, severe, in the right face, right head. No confusion, disorientation, weakness in her extremities. She suffered a laceration to the right cheek, which is also painful. No vision changes. Patient also notes left hip soreness, no distal weakness. She has a history of carcinoid, 10 years ago.  Past Medical History:  Diagnosis Date  . Goiter   . Malignant carcinoid tumor of the ileum [facility])     Patient Active Problem List   Diagnosis Date Noted  . Diarrhea 09/01/2017  . Weight loss, non-intentional 09/01/2017  . Cervicalgia 04/18/2016  . Vertigo 08/19/2014  . Anxiety disorder 08/19/2014  . Hot flashes 08/19/2014  . Mass of right side of neck 06/17/2014  . right lower back mass 06/30/2012  . Neuroendocrine tumor 11/19/2011    Past Surgical History:  Procedure Laterality Date  . APPENDECTOMY    . COLPOSCOPY    . OVARIAN CYST REMOVAL    . THYROIDECTOMY       OB History   None      Home Medications    Prior to Admission medications   Medication Sig Start Date End Date Taking? Authorizing Provider  escitalopram (LEXAPRO) 20 MG tablet Take 20 mg by mouth daily as needed (mood and anxiety.).  10/22/17  Yes [provider]  Multiple Vitamins-Minerals (MULTIVITAMIN PO) Take 1 tablet by mouth daily.   Yes [provider]  Phenyleph-CPM-DM-APAP (ALKA-SELTZER PLUS COLD & COUGH) 08-14-08-325 MG CAPS Take by mouth. As directed, per package instructions.   Yes [provider]  ALPRAZolam Duanne Moron) 0.5 MG tablet Take 0.5 mg by mouth daily as needed for anxiety.     [provider]    Family History Family History  Problem Relation Age of Onset  . Cancer Mother        colon cancer    Social History Social History   Tobacco Use  . Smoking status: Never Smoker  . Smokeless tobacco: Never Used  Substance Use Topics  . Alcohol use: No  . Drug use: No     Allergies   Latex   Review of Systems Review of Systems  Constitutional:       Per HPI, otherwise negative  HENT:       Per HPI, otherwise negative  Respiratory:       Per HPI, otherwise negative  Cardiovascular:       Per HPI, otherwise negative  Gastrointestinal: Negative for vomiting.  Endocrine:       Negative aside from HPI  Genitourinary:       Neg aside from HPI   Musculoskeletal:       Per HPI, otherwise negative  Skin: Positive for wound.  Neurological: Negative for syncope.     Physical Exam  Updated Vital Signs BP (!) 146/91   Pulse 85   Temp 99.2 F (37.3 C)   Resp 15   SpO2 100%   Physical Exam  Constitutional: She is oriented to person, place, and time. She appears well-developed and well-nourished. No distress.  HENT:  Head: Normocephalic.  Swelling and ecchymosis throughout the right face just lateral to the periorbital area, with maxillary tenderness to palpation, swelling as well. On the inferior edge of the maxilla there is a linear wound, partial dermal depth, 6 cm  Eyes: Conjunctivae and EOM are normal.  Neck: No spinous process tenderness and no muscular tenderness present. No neck rigidity. No edema, no erythema and normal range of motion present.  Cardiovascular: Normal  rate and regular rhythm.  Pulmonary/Chest: Effort normal and breath sounds normal. No stridor. No respiratory distress.  Abdominal: She exhibits no distension.  Musculoskeletal: She exhibits no edema.  Left hip pain, tenderness palpation, no deformity, patient moves the leg spontaneously.  Neurological: She is alert and oriented to person, place, and time. She displays no atrophy and no tremor. No cranial nerve deficit. She exhibits normal muscle tone. She displays no seizure activity.  Skin: Skin is warm and dry.  Psychiatric: She has a normal mood and affect.  Nursing note and vitals reviewed.    ED Treatments / Results  Labs (all labs ordered are listed, but only abnormal results are displayed) Labs Reviewed  URINALYSIS, ROUTINE W REFLEX MICROSCOPIC - Abnormal; Notable for the following components:      Result Value   Color, Urine STRAW (*)    Specific Gravity, Urine 1.003 (*)    Hgb urine dipstick SMALL (*)    All other components within normal limits  CBC WITH DIFFERENTIAL/PLATELET  BASIC METABOLIC PANEL  TROPONIN I    EKG None  Radiology Dg Chest 2 View  Result Date: 01/29/2018 CLINICAL DATA:  Syncope. EXAM: CHEST - 2 VIEW COMPARISON:  January 08, 2008 FINDINGS: The heart size and mediastinal contours are within normal limits. Both lungs are clear. The visualized skeletal structures are unremarkable. IMPRESSION: No active cardiopulmonary disease. Electronically Signed   By: Dorise Bullion III M.D   On: 01/29/2018 08:49   Ct Head Wo Contrast  Result Date: 01/29/2018 CLINICAL DATA:  Recent syncopal episode with facial pain, initial encounter EXAM: CT HEAD WITHOUT CONTRAST CT MAXILLOFACIAL WITHOUT CONTRAST TECHNIQUE: Multidetector CT imaging of the head and maxillofacial structures were performed using the standard protocol without intravenous contrast. Multiplanar CT image reconstructions of the maxillofacial structures were also generated. COMPARISON:  None. FINDINGS:  CT HEAD FINDINGS Brain: No evidence of acute infarction, hemorrhage, hydrocephalus, extra-axial collection or mass lesion/mass effect. Vascular: No hyperdense vessel or unexpected calcification. Skull: Normal. Negative for fracture or focal lesion. Other: Mild soft tissue swelling is noted lateral to the right orbit consistent with the recent injury. CT MAXILLOFACIAL FINDINGS Osseous: No acute osseous abnormality is noted. Considerable dental hardware is noted. Orbits: Orbits and their contents are within normal limits. Sinuses: Paranasal sinuses are well aerated. Soft tissues: Soft tissue swelling is noted lateral to the right orbit and inferior to the right orbit consistent with the recent injury. A small amount of air is noted in the subcutaneous tissue consistent with the recent laceration. IMPRESSION: CT of the head: No acute intracranial abnormality noted. CT of the maxillofacial bones: No acute bony abnormality is noted. Soft tissue changes consistent with the recent injury are seen on the right. Electronically Signed   By: Elta Guadeloupe  Lukens M.D.   On: 01/29/2018 09:23   Dg Hip Unilat W Or Wo Pelvis 2-3 Views Left  Result Date: 01/29/2018 CLINICAL DATA:  Left hip pain after fall. EXAM: DG HIP (WITH OR WITHOUT PELVIS) 2-3V LEFT COMPARISON:  None. FINDINGS: There is no evidence of hip fracture or dislocation. There is no evidence of arthropathy or other focal bone abnormality. IMPRESSION: Negative. Electronically Signed   By: Marijo Conception, M.D.   On: 01/29/2018 09:30   Ct Maxillofacial Wo Contrast  Result Date: 01/29/2018 CLINICAL DATA:  Recent syncopal episode with facial pain, initial encounter EXAM: CT HEAD WITHOUT CONTRAST CT MAXILLOFACIAL WITHOUT CONTRAST TECHNIQUE: Multidetector CT imaging of the head and maxillofacial structures were performed using the standard protocol without intravenous contrast. Multiplanar CT image reconstructions of the maxillofacial structures were also generated.  COMPARISON:  None. FINDINGS: CT HEAD FINDINGS Brain: No evidence of acute infarction, hemorrhage, hydrocephalus, extra-axial collection or mass lesion/mass effect. Vascular: No hyperdense vessel or unexpected calcification. Skull: Normal. Negative for fracture or focal lesion. Other: Mild soft tissue swelling is noted lateral to the right orbit consistent with the recent injury. CT MAXILLOFACIAL FINDINGS Osseous: No acute osseous abnormality is noted. Considerable dental hardware is noted. Orbits: Orbits and their contents are within normal limits. Sinuses: Paranasal sinuses are well aerated. Soft tissues: Soft tissue swelling is noted lateral to the right orbit and inferior to the right orbit consistent with the recent injury. A small amount of air is noted in the subcutaneous tissue consistent with the recent laceration. IMPRESSION: CT of the head: No acute intracranial abnormality noted. CT of the maxillofacial bones: No acute bony abnormality is noted. Soft tissue changes consistent with the recent injury are seen on the right. Electronically Signed   By: Inez Catalina M.D.   On: 01/29/2018 09:23    Procedures Procedures (including critical care time)  Medications Ordered in ED Medications  bacitracin ointment 1 application (has no administration in time range)  sodium chloride 0.9 % bolus 500 mL (0 mLs Intravenous Stopped 01/29/18 0857)  acetaminophen (TYLENOL) tablet 1,000 mg (1,000 mg Oral Given 01/29/18 0801)     Initial Impression / Assessment and Plan / ED Course  I have reviewed the triage vital signs and the nursing notes.  Pertinent labs & imaging results that were available during my care of the patient were reviewed by me and considered in my medical decision making (see chart for details).  10:11 AM 10:11 AM On repeat exam the patient is awake and alert. She feels slightly better, though she has mild persistent discomfort about her right lateral head. No new weakness  anywhere. We discussed all findings, including reassuring labs, initial imaging studies.  Facial wound has been treated with antibiotics, Steri-Strips.  This elderly female presents after an episode of syncope, with prodromal lightheadedness, but no pain. Patient is awake and alert, but has ongoing discomfort, but no focal neurologic deficiencies. Initial studies reassuring. Given unclear circumstances for syncope, the patient was admitted for further evaluation and management.   Final Clinical Impressions(s) / ED Diagnoses  Syncope Facial laceration   Carmin Muskrat, MD 01/29/18 1012

## 2018-01-29 NOTE — H&P (Signed)
History and Physical    Samantha Martinez LNL:[id number] DOB: 04/05/1951 DOA: 01/29/2018  I have briefly reviewed the patient's prior medical records in [place]  PCP: Seward Carol, MD  Patient coming from: home  Chief Complaint: syncope  HPI: Samantha Martinez is a 67 y.o. female with medical history significant of neuroendocrine tumor status post resection 10 years ago, with no evidence of recurrence, followed by Dr. Alvy Bimler as an outpatient, depression, who comes to the hospital with chief complaint of syncopal episode.  Patient reports that she has felt slightly lightheaded and dizzy over the last week, more so yesterday.  She woke up around 1:30 AM (usually goes to the bathroom 1-2 times every night), and felt lightheaded while walking to the bathroom.  She sat down and had a bowel movement.  She tried to get up but still felt lightheaded, and waited a minute or 2.  Eventually she may await back towards the bedroom, and next thing she knows she realized that she fell is on the floor and there is trace amount of blood around her and noticed that she hit her right cheek.  She denies any recent fever or chills.  She did complain of palpitations and felt like her heart was racing before passing out.  She has a history of vertigo, and to some extent her lightheadedness over the last week did remind her of her prior episodes.  She did not have any chest pain or chest pressure.  She denies having abdominal pain, nausea or vomiting.  She does have loose stools which is chronic for her.  She denies any dehydration and reports having good p.o. intake.  She just started going back to the gym this week and once twice, worked up about half an hour walking on a treadmill.  No issues with exercise.  ED Course: In the emergency room vital signs are stable, she is borderline orthostatic by heart rate but not hypotensive, blood work is unremarkable.  EKG shows sinus rhythm without ST or  T-segment changes.  Review of Systems: As per HPI otherwise 10 point review of systems negative.   Past Medical History:  Diagnosis Date  . Goiter   . Malignant carcinoid tumor of the ileum [facility])     Past Surgical History:  Procedure Laterality Date  . APPENDECTOMY    . COLPOSCOPY    . OVARIAN CYST REMOVAL    . THYROIDECTOMY      reports that she has never smoked. She has never used smokeless tobacco. She reports that she does not drink alcohol or use drugs.  Allergies  Allergen Reactions  . Latex Hives, Itching and Rash    Family History  Problem Relation Age of Onset  . Cancer Mother        colon cancer    Prior to Admission medications   Medication Sig Start Date End Date Taking? Authorizing Provider  escitalopram (LEXAPRO) 20 MG tablet Take 20 mg by mouth daily as needed (mood and anxiety.).  10/22/17  Yes [provider]  Multiple Vitamins-Minerals (MULTIVITAMIN PO) Take 1 tablet by mouth daily.   Yes [provider]  Phenyleph-CPM-DM-APAP (ALKA-SELTZER PLUS COLD & COUGH) 08-14-08-325 MG CAPS Take by mouth. As directed, per package instructions.   Yes [provider]  ALPRAZolam Duanne Moron) 0.5 MG tablet Take 0.5 mg by mouth daily as needed for anxiety.     [provider]    Physical Exam: Vitals:   01/29/18 0914 01/29/18 0930 01/29/18  1000 01/29/18 1031  BP: (!) 149/92 (!) 146/91 (!) 154/95 119/87  Pulse: 87 85 81 78  Resp: 12 15 16 11   Temp:      SpO2: 100% 100% 98% 100%    Constitutional: NAD, calm, comfortable Eyes: PERRL, lids and conjunctivae normal ENMT: Mucous membranes are moist.  Neck: normal, supple Respiratory: clear to auscultation bilaterally, no wheezing, no crackles. Normal respiratory effort. No accessory muscle use.   Cardiovascular: Regular rate and rhythm, no murmurs / rubs / gallops. No extremity edema. 2+ pedal pulses.  Abdomen: no tenderness Musculoskeletal: no clubbing / cyanosis  Skin: no  rashes Neurologic: non focal, equal strength  Psychiatric: Normal judgment and insight. Alert and oriented x 3. Normal mood.   Labs on Admission: I have personally reviewed following labs and imaging studies  CBC: Recent Labs  Lab 01/29/18 0733  WBC 8.5  NEUTROABS 5.9  HGB 13.1  HCT 40.6  MCV 94.9  PLT 287   Basic Metabolic Panel: Recent Labs  Lab 01/29/18 0733  NA 143  K 3.8  CL 105  CO2 29  GLUCOSE 97  BUN 10  CREATININE 0.81  CALCIUM 9.7   GFR: CrCl cannot be calculated (Unknown ideal weight.). Liver Function Tests: No results for input(s): AST, ALT, ALKPHOS, BILITOT, PROT, ALBUMIN in the last 168 hours. No results for input(s): LIPASE, AMYLASE in the last 168 hours. No results for input(s): AMMONIA in the last 168 hours. Coagulation Profile: No results for input(s): INR, PROTIME in the last 168 hours. Cardiac Enzymes: Recent Labs  Lab 01/29/18 0733  TROPONINI <0.03   BNP (last 3 results) No results for input(s): PROBNP in the last 8760 hours. HbA1C: No results for input(s): HGBA1C in the last 72 hours. CBG: No results for input(s): GLUCAP in the last 168 hours. Lipid Profile: No results for input(s): CHOL, HDL, LDLCALC, TRIG, CHOLHDL, LDLDIRECT in the last 72 hours. Thyroid Function Tests: No results for input(s): TSH, T4TOTAL, FREET4, T3FREE, THYROIDAB in the last 72 hours. Anemia Panel: No results for input(s): VITAMINB12, FOLATE, FERRITIN, TIBC, IRON, RETICCTPCT in the last 72 hours. Urine analysis:    Component Value Date/Time   COLORURINE STRAW (A) 01/29/2018 0733   APPEARANCEUR CLEAR 01/29/2018 0733   LABSPEC 1.003 (L) 01/29/2018 0733   PHURINE 7.0 01/29/2018 0733   GLUCOSEU NEGATIVE 01/29/2018 0733   HGBUR SMALL (A) 01/29/2018 0733   BILIRUBINUR NEGATIVE 01/29/2018 0733   KETONESUR NEGATIVE 01/29/2018 0733   PROTEINUR NEGATIVE 01/29/2018 0733   UROBILINOGEN 0.2 03/06/2008 1950   NITRITE NEGATIVE 01/29/2018 0733   LEUKOCYTESUR NEGATIVE  01/29/2018 0733     Radiological Exams on Admission: Dg Chest 2 View  Result Date: 01/29/2018 CLINICAL DATA:  Syncope. EXAM: CHEST - 2 VIEW COMPARISON:  January 08, 2008 FINDINGS: The heart size and mediastinal contours are within normal limits. Both lungs are clear. The visualized skeletal structures are unremarkable. IMPRESSION: No active cardiopulmonary disease. Electronically Signed   By: Dorise Bullion III M.D   On: 01/29/2018 08:49   Ct Head Wo Contrast  Result Date: 01/29/2018 CLINICAL DATA:  Recent syncopal episode with facial pain, initial encounter EXAM: CT HEAD WITHOUT CONTRAST CT MAXILLOFACIAL WITHOUT CONTRAST TECHNIQUE: Multidetector CT imaging of the head and maxillofacial structures were performed using the standard protocol without intravenous contrast. Multiplanar CT image reconstructions of the maxillofacial structures were also generated. COMPARISON:  None. FINDINGS: CT HEAD FINDINGS Brain: No evidence of acute infarction, hemorrhage, hydrocephalus, extra-axial collection or mass lesion/mass effect. Vascular: No  hyperdense vessel or unexpected calcification. Skull: Normal. Negative for fracture or focal lesion. Other: Mild soft tissue swelling is noted lateral to the right orbit consistent with the recent injury. CT MAXILLOFACIAL FINDINGS Osseous: No acute osseous abnormality is noted. Considerable dental hardware is noted. Orbits: Orbits and their contents are within normal limits. Sinuses: Paranasal sinuses are well aerated. Soft tissues: Soft tissue swelling is noted lateral to the right orbit and inferior to the right orbit consistent with the recent injury. A small amount of air is noted in the subcutaneous tissue consistent with the recent laceration. IMPRESSION: CT of the head: No acute intracranial abnormality noted. CT of the maxillofacial bones: No acute bony abnormality is noted. Soft tissue changes consistent with the recent injury are seen on the right. Electronically  Signed   By: Inez Catalina M.D.   On: 01/29/2018 09:23   Dg Hip Unilat W Or Wo Pelvis 2-3 Views Left  Result Date: 01/29/2018 CLINICAL DATA:  Left hip pain after fall. EXAM: DG HIP (WITH OR WITHOUT PELVIS) 2-3V LEFT COMPARISON:  None. FINDINGS: There is no evidence of hip fracture or dislocation. There is no evidence of arthropathy or other focal bone abnormality. IMPRESSION: Negative. Electronically Signed   By: Marijo Conception, M.D.   On: 01/29/2018 09:30   Ct Maxillofacial Wo Contrast  Result Date: 01/29/2018 CLINICAL DATA:  Recent syncopal episode with facial pain, initial encounter EXAM: CT HEAD WITHOUT CONTRAST CT MAXILLOFACIAL WITHOUT CONTRAST TECHNIQUE: Multidetector CT imaging of the head and maxillofacial structures were performed using the standard protocol without intravenous contrast. Multiplanar CT image reconstructions of the maxillofacial structures were also generated. COMPARISON:  None. FINDINGS: CT HEAD FINDINGS Brain: No evidence of acute infarction, hemorrhage, hydrocephalus, extra-axial collection or mass lesion/mass effect. Vascular: No hyperdense vessel or unexpected calcification. Skull: Normal. Negative for fracture or focal lesion. Other: Mild soft tissue swelling is noted lateral to the right orbit consistent with the recent injury. CT MAXILLOFACIAL FINDINGS Osseous: No acute osseous abnormality is noted. Considerable dental hardware is noted. Orbits: Orbits and their contents are within normal limits. Sinuses: Paranasal sinuses are well aerated. Soft tissues: Soft tissue swelling is noted lateral to the right orbit and inferior to the right orbit consistent with the recent injury. A small amount of air is noted in the subcutaneous tissue consistent with the recent laceration. IMPRESSION: CT of the head: No acute intracranial abnormality noted. CT of the maxillofacial bones: No acute bony abnormality is noted. Soft tissue changes consistent with the recent injury are seen on the  right. Electronically Signed   By: Inez Catalina M.D.   On: 01/29/2018 09:23    EKG: Independently reviewed.  Sinus rhythm without ST or T-segment changes  Assessment/Plan Principal Problem:   Syncope Active Problems:   Neuroendocrine tumor   Anxiety disorder   Syncope and collapse -This did appear after having bowel movements suggesting a vasovagal component, however she has not been feeling well for the past several days prior to that and the day off.  Given lightheadedness/dizziness for the past week, also history of neuroendocrine tumor, will obtain an MRI of the brain -Given palpitations preceding syncopal episode, will monitor on telemetry and obtain a 2D echo -Unlikely seizure disorder  Anxiety disorder -Continue Lexapro, she is no longer taking Xanax for several months  History of neuroendocrine tumor -Managed as an outpatient   DVT prophylaxis: Lovenox Code Status: Full code Family Communication: Friend present at bedside Disposition Plan: Admit to telemetry, Home likely  within 24 hours Consults called: None    Admission status: Observation  At the point of initial evaluation, it is my clinical opinion that admission for OBSERVATION is reasonable and necessary because the patient's presenting complaints in the context of their chronic conditions represent sufficient risk of deterioration or significant morbidity to constitute reasonable grounds for close observation in the hospital setting, but that the patient may be medically stable for discharge from the hospital within 24 to 48 hours.    Marzetta Board, MD [facility] Pager [phone]  If 7PM-7AM, please contact night-coverage [email or web address] Password [email or web address]  01/29/2018, 10:46 AM

## 2018-01-29 NOTE — ED Triage Notes (Signed)
Pt reports that she got up around 130a to have a BM and passed out while walking back to the bed. She does not know how long she was on the floor. She has a laceration to her R cheek and pain on  The R side of her face and R shoulder. A&Ox4. Ambulatory.

## 2018-01-29 NOTE — ED Notes (Signed)
ED TO INPATIENT HANDOFF REPORT  Name/Age/Gender Rogue Jury Mendez 67 y.o. female  Code Status   Home/SNF/Other Home  Chief Complaint Loss of Consciousness; Facial Laceration  Level of Care/Admitting Diagnosis ED Disposition    ED Disposition Condition Comment   Admit  Hospital Area: Mobile [[id number]]  Level of Care: Telemetry [5]  Admit to tele based on following criteria: Eval of Syncope  Diagnosis: Syncope [379024]  Admitting Physician: Caren Griffins [phone]  Attending Physician: Caren Griffins [[id number]]  PT Class (Do Not Modify): Observation [104]  PT Acc Code (Do Not Modify): Observation [10022]       Medical History Past Medical History:  Diagnosis Date  . Goiter   . 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 Latex Hives, Itching and Rash    IV Location/Drains/Wounds Patient Lines/Drains/Airways Status   Active Line/Drains/Airways    Name:   Placement date:   Placement time:   Site:   Days:   Peripheral IV 09/01/14 Left Antecubital   09/01/14    1706    Antecubital   1246   Peripheral IV 09/09/17 Left Antecubital   09/09/17    1442    Antecubital   142   Peripheral IV 01/29/18 Left Antecubital   01/29/18    0735    Antecubital   less than 1          Labs/Imaging Results for orders placed or performed during the hospital encounter of 01/29/18 (from the past 48 hour(s))  Urinalysis, Routine w reflex microscopic     Status: Abnormal   Collection Time: 01/29/18  7:33 AM  Result Value Ref Range   Color, Urine STRAW (A) YELLOW   APPearance CLEAR CLEAR   Specific Gravity, Urine 1.003 (L) 1.005 - 1.030   pH 7.0 5.0 - 8.0   Glucose, UA NEGATIVE NEGATIVE mg/dL   Hgb urine dipstick SMALL (A) NEGATIVE   Bilirubin Urine NEGATIVE NEGATIVE   Ketones, ur NEGATIVE NEGATIVE mg/dL   Protein, ur NEGATIVE NEGATIVE mg/dL   Nitrite NEGATIVE NEGATIVE   Leukocytes, UA NEGATIVE NEGATIVE    RBC / HPF 0-5 0 - 5 RBC/hpf   WBC, UA 0-5 0 - 5 WBC/hpf   Bacteria, UA NONE SEEN NONE SEEN   Squamous Epithelial / LPF 0-5 0 - 5    Comment: Performed at [facility], [place] [address], [place], [place] [address]  CBC with Differential/Platelet     Status: None   Collection Time: 01/29/18  7:33 AM  Result Value Ref Range   WBC 8.5 4.0 - 10.5 K/uL   RBC 4.28 3.87 - 5.11 MIL/uL   Hemoglobin 13.1 12.0 - 15.0 g/dL   HCT 40.6 36.0 - 46.0 %   MCV 94.9 80.0 - 100.0 fL   MCH 30.6 26.0 - 34.0 pg   MCHC 32.3 30.0 - 36.0 g/dL   RDW 13.2 11.5 - 15.5 %   Platelets 238 150 - 400 K/uL   nRBC 0.0 0.0 - 0.2 %   Neutrophils Relative % 71 %   Neutro Abs 5.9 1.7 - 7.7 K/uL   Lymphocytes Relative 21 %   Lymphs Abs 1.8 0.7 - 4.0 K/uL   Monocytes Relative 8 %   Monocytes Absolute 0.7 0.1 - 1.0 K/uL   Eosinophils Relative 0 %   Eosinophils Absolute 0.0 0.0 - 0.5 K/uL   Basophils Relative 0 %   Basophils Absolute 0.0  0.0 - 0.1 K/uL   Immature Granulocytes 0 %   Abs Immature Granulocytes 0.02 0.00 - 0.07 K/uL    Comment: Performed at [facility], [place] [address], [place], [place] [address]  Basic metabolic panel     Status: None   Collection Time: 01/29/18  7:33 AM  Result Value Ref Range   Sodium 143 135 - 145 mmol/L   Potassium 3.8 3.5 - 5.1 mmol/L   Chloride 105 98 - 111 mmol/L   CO2 29 22 - 32 mmol/L   Glucose, Bld 97 70 - 99 mg/dL   BUN 10 8 - 23 mg/dL   Creatinine, Ser 0.81 0.44 - 1.00 mg/dL   Calcium 9.7 8.9 - 10.3 mg/dL   GFR calc non Af Amer >60 >60 mL/min   GFR calc Af Amer >60 >60 mL/min    Comment: (NOTE) The eGFR has been calculated using the CKD EPI equation. This calculation has not been validated in all clinical situations. eGFR's persistently <60 mL/min signify possible Chronic Kidney Disease.    Anion gap 9 5 - 15    Comment: Performed at [facility], [place] [address] [place], [place] [address]  Troponin I      Status: None   Collection Time: 01/29/18  7:33 AM  Result Value Ref Range   Troponin I <0.03 <0.03 ng/mL    Comment: Performed at [facility], [place] [address], [place], [place] [address]   Dg Chest 2 View  Result Date: 01/29/2018 CLINICAL DATA:  Syncope. EXAM: CHEST - 2 VIEW COMPARISON:  January 08, 2008 FINDINGS: The heart size and mediastinal contours are within normal limits. Both lungs are clear. The visualized skeletal structures are unremarkable. IMPRESSION: No active cardiopulmonary disease. Electronically Signed   By: Dorise Bullion III M.D   On: 01/29/2018 08:49   Ct Head Wo Contrast  Result Date: 01/29/2018 CLINICAL DATA:  Recent syncopal episode with facial pain, initial encounter EXAM: CT HEAD WITHOUT CONTRAST CT MAXILLOFACIAL WITHOUT CONTRAST TECHNIQUE: Multidetector CT imaging of the head and maxillofacial structures were performed using the standard protocol without intravenous contrast. Multiplanar CT image reconstructions of the maxillofacial structures were also generated. COMPARISON:  None. FINDINGS: CT HEAD FINDINGS Brain: No evidence of acute infarction, hemorrhage, hydrocephalus, extra-axial collection or mass lesion/mass effect. Vascular: No hyperdense vessel or unexpected calcification. Skull: Normal. Negative for fracture or focal lesion. Other: Mild soft tissue swelling is noted lateral to the right orbit consistent with the recent injury. CT MAXILLOFACIAL FINDINGS Osseous: No acute osseous abnormality is noted. Considerable dental hardware is noted. Orbits: Orbits and their contents are within normal limits. Sinuses: Paranasal sinuses are well aerated. Soft tissues: Soft tissue swelling is noted lateral to the right orbit and inferior to the right orbit consistent with the recent injury. A small amount of air is noted in the subcutaneous tissue consistent with the recent laceration. IMPRESSION: CT of the head: No acute intracranial abnormality noted. CT  of the maxillofacial bones: No acute bony abnormality is noted. Soft tissue changes consistent with the recent injury are seen on the right. Electronically Signed   By: Inez Catalina M.D.   On: 01/29/2018 09:23   Dg Hip Unilat W Or Wo Pelvis 2-3 Views Left  Result Date: 01/29/2018 CLINICAL DATA:  Left hip pain after fall. EXAM: DG HIP (WITH OR WITHOUT PELVIS) 2-3V LEFT COMPARISON:  None. FINDINGS: There is no evidence of hip fracture or dislocation. There is no evidence of arthropathy  or other focal bone abnormality. IMPRESSION: Negative. Electronically Signed   By: Marijo Conception, M.D.   On: 01/29/2018 09:30   Ct Maxillofacial Wo Contrast  Result Date: 01/29/2018 CLINICAL DATA:  Recent syncopal episode with facial pain, initial encounter EXAM: CT HEAD WITHOUT CONTRAST CT MAXILLOFACIAL WITHOUT CONTRAST TECHNIQUE: Multidetector CT imaging of the head and maxillofacial structures were performed using the standard protocol without intravenous contrast. Multiplanar CT image reconstructions of the maxillofacial structures were also generated. COMPARISON:  None. FINDINGS: CT HEAD FINDINGS Brain: No evidence of acute infarction, hemorrhage, hydrocephalus, extra-axial collection or mass lesion/mass effect. Vascular: No hyperdense vessel or unexpected calcification. Skull: Normal. Negative for fracture or focal lesion. Other: Mild soft tissue swelling is noted lateral to the right orbit consistent with the recent injury. CT MAXILLOFACIAL FINDINGS Osseous: No acute osseous abnormality is noted. Considerable dental hardware is noted. Orbits: Orbits and their contents are within normal limits. Sinuses: Paranasal sinuses are well aerated. Soft tissues: Soft tissue swelling is noted lateral to the right orbit and inferior to the right orbit consistent with the recent injury. A small amount of air is noted in the subcutaneous tissue consistent with the recent laceration. IMPRESSION: CT of the head: No acute intracranial  abnormality noted. CT of the maxillofacial bones: No acute bony abnormality is noted. Soft tissue changes consistent with the recent injury are seen on the right. Electronically Signed   By: Inez Catalina M.D.   On: 01/29/2018 09:23    Pending Labs [employer] (From admission, onward)    Start     Ordered   Signed and Held  Comprehensive metabolic panel  Tomorrow morning,   R     Signed and Held   Signed and Held  CBC  Tomorrow morning,   R     Signed and Held   Signed and Held  HIV antibody (Routine Testing)  Once,   R     Signed and Held   Signed and Held  TSH  Once,   R     Signed and Held          Vitals/Pain Today's Vitals   01/29/18 0914 01/29/18 0930 01/29/18 1000 01/29/18 1031  BP: (!) 149/92 (!) 146/91 (!) 154/95 119/87  Pulse: 87 85 81 78  Resp: _0 Temp:      SpO2: 100% 100% 98% 100%  PainSc:        Isolation Precautions No active isolations  Medications Medications  bacitracin ointment 1 application (has no administration in time range)  sodium chloride 0.9 % bolus 500 mL (0 mLs Intravenous Stopped 01/29/18 0857)  acetaminophen (TYLENOL) tablet 1,000 mg (1,000 mg Oral Given 01/29/18 0801)    Mobility walks

## 2018-01-29 NOTE — ED Notes (Signed)
Patient complaining of pain in L hip and head. MD notified.

## 2018-01-29 NOTE — Progress Notes (Signed)
  Echocardiogram 2D Echocardiogram has been performed.  Bobbye Charleston 01/29/2018, 4:24 PM

## 2018-01-29 NOTE — ED Notes (Signed)
Patient back from CT.

## 2018-01-29 NOTE — ED Notes (Signed)
Report called, given to her RN.

## 2018-01-30 DIAGNOSIS — S0181XD Laceration without foreign body of other part of head, subsequent encounter: Secondary | ICD-10-CM | POA: Diagnosis not present

## 2018-01-30 DIAGNOSIS — F419 Anxiety disorder, unspecified: Secondary | ICD-10-CM | POA: Diagnosis not present

## 2018-01-30 DIAGNOSIS — R55 Syncope and collapse: Secondary | ICD-10-CM | POA: Diagnosis not present

## 2018-01-30 LAB — CBC
HCT: 36.1 % (ref 36.0–46.0)
Hemoglobin: 11.6 g/dL — ABNORMAL LOW (ref 12.0–15.0)
MCH: 30.4 pg (ref 26.0–34.0)
MCHC: 32.1 g/dL (ref 30.0–36.0)
MCV: 94.8 fL (ref 80.0–100.0)
Platelets: 206 10*3/uL (ref 150–400)
RBC: 3.81 MIL/uL — ABNORMAL LOW (ref 3.87–5.11)
RDW: 13.2 % (ref 11.5–15.5)
WBC: 7 10*3/uL (ref 4.0–10.5)
nRBC: 0 % (ref 0.0–0.2)

## 2018-01-30 LAB — COMPREHENSIVE METABOLIC PANEL
ALT: 16 U/L (ref 0–44)
AST: 21 U/L (ref 15–41)
Albumin: 3.3 g/dL — ABNORMAL LOW (ref 3.5–5.0)
Alkaline Phosphatase: 48 U/L (ref 38–126)
Anion gap: 7 (ref 5–15)
BUN: 15 mg/dL (ref 8–23)
CO2: 26 mmol/L (ref 22–32)
Calcium: 8.8 mg/dL — ABNORMAL LOW (ref 8.9–10.3)
Chloride: 106 mmol/L (ref 98–111)
Creatinine, Ser: 0.7 mg/dL (ref 0.44–1.00)
GFR calc Af Amer: >60 mL/min (ref >60)
GFR calc non Af Amer: >60 mL/min (ref >60)
Glucose, Bld: 103 mg/dL — ABNORMAL HIGH (ref 70–99)
Potassium: 3.6 mmol/L (ref 3.5–5.1)
Sodium: 139 mmol/L (ref 135–145)
Total Bilirubin: 0.7 mg/dL (ref 0.3–1.2)
Total Protein: 6.9 g/dL (ref 6.5–8.1)

## 2018-01-30 LAB — HIV ANTIBODY (ROUTINE TESTING W REFLEX): HIV Screen 4th Generation wRfx: NONREACTIVE

## 2018-01-30 NOTE — Discharge Summary (Signed)
Physician Discharge Summary  Samantha Martinez  ALP:[id number]  DOB: 1950-07-15  DOA: 01/29/2018 PCP: Seward Carol, MD  Admit date: 01/29/2018 Discharge date: 01/30/2018  Admitted From: Home Disposition: Home  Recommendations for Outpatient Follow-up:  1. Follow up with PCP in 1 week 2. Please obtain BMP/CBC in one week to monitor hemoglobin and renal function.  Discharge Condition: Stable CODE STATUS: Full code Diet recommendation: Heart Healthy   Brief/Interim Summary: For full details see H&P/Progress note, but in brief, Samantha Martinez is a 67 year old female with medical history significant for neuroendocrine tumor status post resection 10 years ago, who presented to the emergency department after having a syncopal episode.  Patient was lightheaded while walking to the bathroom sat down, had a bowel movement, after he got up felt lightheaded and next thing that she realized is being on the floor with trace amount of blood around her face.  Upon ED evaluation she was borderline orthostatic by heart rate but nothing hypotensive.  Blood work was unremarkable and EKG was normal sinus rhythm with no ST segment changes.  Patient was admitted for syncope work-up.  MRI of the brain with no acute abnormalities, echocardiogram shows normal EF and no wall motion abnormalities.  Lab work-up stable.  Symptoms were felt to be vasovagal from bowel movement.  After observation patient clinically improved and ambulated with no issues.  Patient was deemed stable for discharge and follow-up with PCP as an outpatient.  Subjective: Patient seen and examined, she denies chest pain, shortness of breath, palpitations, dizziness and weakness.  No acute events overnight.  Tolerating diet well.  Ambulating with no issues.  Discharge Diagnoses/Hospital Course:  Syncope Felt to be secondary to vasovagal component.  Work-up negative which included MRI, echocardiogram, telemetry monitoring and EKG.   No electrolyte abnormalities.  Patient ambulating with no issues. ?  Vertigo.  Advised to follow-up PCP if recurrence.   Anxiety disorder Continue Lexapro  History of neuroendocrine tumor Follow up as an outpatient.   Facial laceration  Treated with Steri-Strips and Neosporin  On the day of the discharge the patient's vitals were stable, and no other acute medical condition were reported by patient. the patient was felt safe to be discharge to home.   Discharge Instructions  You were cared for by a hospitalist during your hospital stay. If you have any questions about your discharge medications or the care you received while you were in the hospital after you are discharged, you can call the unit and asked to speak with the hospitalist on call if the hospitalist that took care of you is not available. Once you are discharged, your primary care physician will handle any further medical issues. Please note that NO REFILLS for any discharge medications will be authorized once you are discharged, as it is imperative that you return to your primary care physician (or establish a relationship with a primary care physician if you do not have one) for your aftercare needs so that they can reassess your need for medications and monitor your lab values.  Discharge Instructions    Call MD for:  difficulty breathing, headache or visual disturbances   Complete by:  As directed    Call MD for:  extreme fatigue   Complete by:  As directed    Call MD for:  hives   Complete by:  As directed    Call MD for:  persistant dizziness or light-headedness   Complete by:  As directed    Call MD  for:  persistant nausea and vomiting   Complete by:  As directed    Call MD for:  redness, tenderness, or signs of infection (pain, swelling, redness, odor or green/yellow discharge around incision site)   Complete by:  As directed    Call MD for:  severe uncontrolled pain   Complete by:  As directed    Call MD for:   temperature >100.4   Complete by:  As directed    Diet - low sodium heart healthy   Complete by:  As directed    Increase activity slowly   Complete by:  As directed      Allergies as of 01/30/2018      Reactions   Latex Hives, Itching, Rash      Medication List    STOP taking these medications   ALPRAZolam 0.5 MG tablet Commonly known as:  XANAX     TAKE these medications   ALKA-SELTZER PLUS COLD & COUGH 08-14-08-325 MG Caps Generic drug:  Phenyleph-CPM-DM-APAP Take by mouth. As directed, per package instructions.   escitalopram 20 MG tablet Commonly known as:  LEXAPRO Take 20 mg by mouth daily as needed (mood and anxiety.).   MULTIVITAMIN PO Take 1 tablet by mouth daily.      Follow-up Information    Seward Carol, MD. Schedule an appointment as soon as possible for a visit in 1 week(s).   Specialty:  Internal Medicine Why:  Hospital follow-up Contact information: [address] [place] [address] [phone]          Allergies  Allergen Reactions  . Latex Hives, Itching and Rash    Consultations:   Procedures/Studies: Dg Chest 2 View  Result Date: 01/29/2018 CLINICAL DATA:  Syncope. EXAM: CHEST - 2 VIEW COMPARISON:  January 08, 2008 FINDINGS: The heart size and mediastinal contours are within normal limits. Both lungs are clear. The visualized skeletal structures are unremarkable. IMPRESSION: No active cardiopulmonary disease. Electronically Signed   By: Dorise Bullion III M.D   On: 01/29/2018 08:49   Ct Head Wo Contrast  Result Date: 01/29/2018 CLINICAL DATA:  Recent syncopal episode with facial pain, initial encounter EXAM: CT HEAD WITHOUT CONTRAST CT MAXILLOFACIAL WITHOUT CONTRAST TECHNIQUE: Multidetector CT imaging of the head and maxillofacial structures were performed using the standard protocol without intravenous contrast. Multiplanar CT image reconstructions of the maxillofacial structures were also generated.  COMPARISON:  None. FINDINGS: CT HEAD FINDINGS Brain: No evidence of acute infarction, hemorrhage, hydrocephalus, extra-axial collection or mass lesion/mass effect. Vascular: No hyperdense vessel or unexpected calcification. Skull: Normal. Negative for fracture or focal lesion. Other: Mild soft tissue swelling is noted lateral to the right orbit consistent with the recent injury. CT MAXILLOFACIAL FINDINGS Osseous: No acute osseous abnormality is noted. Considerable dental hardware is noted. Orbits: Orbits and their contents are within normal limits. Sinuses: Paranasal sinuses are well aerated. Soft tissues: Soft tissue swelling is noted lateral to the right orbit and inferior to the right orbit consistent with the recent injury. A small amount of air is noted in the subcutaneous tissue consistent with the recent laceration. IMPRESSION: CT of the head: No acute intracranial abnormality noted. CT of the maxillofacial bones: No acute bony abnormality is noted. Soft tissue changes consistent with the recent injury are seen on the right. Electronically Signed   By: Inez Catalina M.D.   On: 01/29/2018 09:23   Mr Jeri Cos AT Contrast  Result Date: 01/29/2018 CLINICAL DATA:  Fall.  Confusion.  Possible CVA. EXAM: MRI HEAD WITHOUT AND WITH CONTRAST TECHNIQUE: Multiplanar, multiecho pulse sequences of the brain and surrounding structures were obtained without and with intravenous contrast. CONTRAST:  Gadavist 7 mL. COMPARISON:  CT head earlier today. FINDINGS: Brain: No evidence for acute infarction, hemorrhage, mass lesion, hydrocephalus, or extra-axial fluid. Normal for age cerebral volume. Mild subcortical and periventricular T2 and FLAIR hyperintensities, likely chronic microvascular ischemic change. Post infusion, no abnormal enhancement of the brain or meninges. Incidental venous angioma, versus capillary telangiectasia, RIGHT paramedian brainstem of no clinical consequence. Vascular: Flow voids are maintained. There  is a tiny focus of chronic hemorrhage in the subcortical white matter of the RIGHT occipital lobe. This is nonspecific, but most commonly associated with an incidental microbleed from hypertension. Skull and upper cervical spine: Normal marrow signal. Cervical spondylosis, incompletely evaluated. Sinuses/Orbits: No significant sinus disease.  Negative orbits. Other: Unremarkable middle ear and mastoid compartments. IMPRESSION: No acute stroke.  No abnormal postcontrast enhancement. Normal for age cerebral volume with mild small vessel disease, likely hypertensive related given the tiny microbleed as an incidental finding. No acute posttraumatic sequelae related to the fall are evident. Electronically Signed   By: Staci Righter M.D.   On: 01/29/2018 14:13   Dg Abd 2 Views  Result Date: 01/19/2018 CLINICAL DATA:  Abdominal fullness and pain, initial encounter EXAM: ABDOMEN - 2 VIEW COMPARISON:  None. FINDINGS: Scattered large and small bowel gas is noted. No abnormal mass is identified. No free air is seen. Mild degenerative changes of the lumbar spine are noted. Postsurgical changes are seen in the right lower quadrant consistent with the given clinical history. IMPRESSION: No acute abnormality noted. Electronically Signed   By: Inez Catalina M.D.   On: 01/19/2018 17:10   Dg Hip Unilat W Or Wo Pelvis 2-3 Views Left  Result Date: 01/29/2018 CLINICAL DATA:  Left hip pain after fall. EXAM: DG HIP (WITH OR WITHOUT PELVIS) 2-3V LEFT COMPARISON:  None. FINDINGS: There is no evidence of hip fracture or dislocation. There is no evidence of arthropathy or other focal bone abnormality. IMPRESSION: Negative. Electronically Signed   By: Marijo Conception, M.D.   On: 01/29/2018 09:30   Ct Maxillofacial Wo Contrast  Result Date: 01/29/2018 CLINICAL DATA:  Recent syncopal episode with facial pain, initial encounter EXAM: CT HEAD WITHOUT CONTRAST CT MAXILLOFACIAL WITHOUT CONTRAST TECHNIQUE: Multidetector CT imaging of the  head and maxillofacial structures were performed using the standard protocol without intravenous contrast. Multiplanar CT image reconstructions of the maxillofacial structures were also generated. COMPARISON:  None. FINDINGS: CT HEAD FINDINGS Brain: No evidence of acute infarction, hemorrhage, hydrocephalus, extra-axial collection or mass lesion/mass effect. Vascular: No hyperdense vessel or unexpected calcification. Skull: Normal. Negative for fracture or focal lesion. Other: Mild soft tissue swelling is noted lateral to the right orbit consistent with the recent injury. CT MAXILLOFACIAL FINDINGS Osseous: No acute osseous abnormality is noted. Considerable dental hardware is noted. Orbits: Orbits and their contents are within normal limits. Sinuses: Paranasal sinuses are well aerated. Soft tissues: Soft tissue swelling is noted lateral to the right orbit and inferior to the right orbit consistent with the recent injury. A small amount of air is noted in the subcutaneous tissue consistent with the recent laceration. IMPRESSION: CT of the head: No acute intracranial abnormality noted. CT of the maxillofacial bones: No acute bony abnormality is noted. Soft tissue changes consistent with the recent injury are seen on the right. Electronically Signed   By: Linus Mako.D.  On: 01/29/2018 09:23   ECHO 01/29/18 Impressions:  - Normal LV size with EF 60-65%. Normal RV size and systolic   function. No significant valvular abnormalities.   Discharge Exam: Vitals:   01/29/18 2058 01/30/18 0521  BP: (!) 116/58 120/71  Pulse: 83 74  Resp: 15 17  Temp: 98.4 F (36.9 C) 98.2 F (36.8 C)  SpO2: 96% 95%   Vitals:   01/29/18 1130 01/29/18 1202 01/29/18 2058 01/30/18 0521  BP: (!) 153/92 (!) 150/90 (!) 116/58 120/71  Pulse: 79 78 83 74  Resp: 13 14 15 17   Temp:  98.6 F (37 C) 98.4 F (36.9 C) 98.2 F (36.8 C)  TempSrc:  Oral Oral Oral  SpO2: 98% 99% 96% 95%    General: Pt is alert, awake, not in  acute distress Cardiovascular: RRR, S1/S2 +, no rubs, no gallops Respiratory: CTA bilaterally, no wheezing, no rhonchi   The results of significant diagnostics from this hospitalization (including imaging, microbiology, ancillary and laboratory) are listed below for reference.     Microbiology: No results found for this or any previous visit (from the past 240 hour(s)).   Labs: BNP (last 3 results) No results for input(s): BNP in the last 8760 hours. Basic Metabolic Panel: Recent Labs  Lab 01/29/18 0733 01/30/18 0545  NA 143 139  K 3.8 3.6  CL 105 106  CO2 29 26  GLUCOSE 97 103*  BUN 10 15  CREATININE 0.81 0.70  CALCIUM 9.7 8.8*   Liver Function Tests: Recent Labs  Lab 01/30/18 0545  AST 21  ALT 16  ALKPHOS 48  BILITOT 0.7  PROT 6.9  ALBUMIN 3.3*   No results for input(s): LIPASE, AMYLASE in the last 168 hours. No results for input(s): AMMONIA in the last 168 hours. CBC: Recent Labs  Lab 01/29/18 0733 01/30/18 0545  WBC 8.5 7.0  NEUTROABS 5.9  --   HGB 13.1 11.6*  HCT 40.6 36.1  MCV 94.9 94.8  PLT 238 206   Cardiac Enzymes: Recent Labs  Lab 01/29/18 0733  TROPONINI <0.03   BNP: Invalid input(s): POCBNP CBG: No results for input(s): GLUCAP in the last 168 hours. D-Dimer No results for input(s): DDIMER in the last 72 hours. Hgb A1c No results for input(s): HGBA1C in the last 72 hours. Lipid Profile No results for input(s): CHOL, HDL, LDLCALC, TRIG, CHOLHDL, LDLDIRECT in the last 72 hours. Thyroid function studies Recent Labs    01/29/18 1244  TSH 1.124   Anemia work up No results for input(s): VITAMINB12, FOLATE, FERRITIN, TIBC, IRON, RETICCTPCT in the last 72 hours. Urinalysis    Component Value Date/Time   COLORURINE STRAW (A) 01/29/2018 0733   APPEARANCEUR CLEAR 01/29/2018 0733   LABSPEC 1.003 (L) 01/29/2018 0733   PHURINE 7.0 01/29/2018 0733   GLUCOSEU NEGATIVE 01/29/2018 0733   HGBUR SMALL (A) 01/29/2018 0733   BILIRUBINUR  NEGATIVE 01/29/2018 0733   KETONESUR NEGATIVE 01/29/2018 0733   PROTEINUR NEGATIVE 01/29/2018 0733   UROBILINOGEN 0.2 03/06/2008 1950   NITRITE NEGATIVE 01/29/2018 0733   LEUKOCYTESUR NEGATIVE 01/29/2018 0733   Sepsis Labs Invalid input(s): PROCALCITONIN,  WBC,  LACTICIDVEN Microbiology No results found for this or any previous visit (from the past 240 hour(s)).   Time coordinating discharge: 32 minutes  SIGNED:  Chipper Oman, MD  [facility] 01/30/2018, 10:56 AM  Pager please text page via  [email or web address]  Note - This record has been created using [employer]. Chart creation errors have been sought, but may  not always have been located. Such creation errors do not reflect on the standard of medical care.

## 2018-05-07 ENCOUNTER — Other Ambulatory Visit: Payer: Self-pay | Admitting: Obstetrics and Gynecology

## 2018-05-07 ENCOUNTER — Other Ambulatory Visit: Payer: Self-pay | Admitting: Internal Medicine

## 2018-05-07 DIAGNOSIS — Z1231 Encounter for screening mammogram for malignant neoplasm of breast: Secondary | ICD-10-CM

## 2018-05-22 DIAGNOSIS — H40023 Open angle with borderline findings, high risk, bilateral: Secondary | ICD-10-CM | POA: Diagnosis not present

## 2018-05-22 DIAGNOSIS — H25013 Cortical age-related cataract, bilateral: Secondary | ICD-10-CM | POA: Diagnosis not present

## 2018-05-22 DIAGNOSIS — H2513 Age-related nuclear cataract, bilateral: Secondary | ICD-10-CM | POA: Diagnosis not present

## 2018-05-22 DIAGNOSIS — H02842 Edema of right lower eyelid: Secondary | ICD-10-CM | POA: Diagnosis not present

## 2018-06-01 DIAGNOSIS — J101 Influenza due to other identified influenza virus with other respiratory manifestations: Secondary | ICD-10-CM | POA: Diagnosis not present

## 2018-06-01 DIAGNOSIS — R509 Fever, unspecified: Secondary | ICD-10-CM | POA: Diagnosis not present

## 2018-06-03 ENCOUNTER — Ambulatory Visit: Payer: Medicare Other

## 2018-06-25 ENCOUNTER — Other Ambulatory Visit: Payer: Self-pay

## 2018-06-25 ENCOUNTER — Ambulatory Visit
Admission: RE | Admit: 2018-06-25 | Discharge: 2018-06-25 | Disposition: A | Discharge status: Home / Self Care | Payer: Medicare HMO | Source: Ambulatory Visit | Attending: Obstetrics and Gynecology | Admitting: Obstetrics and Gynecology

## 2018-06-25 DIAGNOSIS — Z1231 Encounter for screening mammogram for malignant neoplasm of breast: Secondary | ICD-10-CM | POA: Diagnosis not present

## 2018-08-18 DIAGNOSIS — Z20828 Contact with and (suspected) exposure to other viral communicable diseases: Secondary | ICD-10-CM | POA: Diagnosis not present

## 2018-08-20 DIAGNOSIS — Z1389 Encounter for screening for other disorder: Secondary | ICD-10-CM | POA: Diagnosis not present

## 2018-08-20 DIAGNOSIS — E7800 Pure hypercholesterolemia, unspecified: Secondary | ICD-10-CM | POA: Diagnosis not present

## 2018-08-20 DIAGNOSIS — R69 Illness, unspecified: Secondary | ICD-10-CM | POA: Diagnosis not present

## 2018-08-20 DIAGNOSIS — Z0000 Encounter for general adult medical examination without abnormal findings: Secondary | ICD-10-CM | POA: Diagnosis not present

## 2018-08-20 DIAGNOSIS — M25511 Pain in right shoulder: Secondary | ICD-10-CM | POA: Diagnosis not present

## 2018-08-21 DIAGNOSIS — E7800 Pure hypercholesterolemia, unspecified: Secondary | ICD-10-CM | POA: Diagnosis not present

## 2018-10-21 ENCOUNTER — Other Ambulatory Visit: Payer: Self-pay

## 2018-10-21 ENCOUNTER — Ambulatory Visit (INDEPENDENT_AMBULATORY_CARE_PROVIDER_SITE_OTHER): Payer: Medicare HMO

## 2018-10-21 ENCOUNTER — Encounter: Payer: Self-pay | Admitting: Orthopaedic Surgery

## 2018-10-21 ENCOUNTER — Ambulatory Visit (INDEPENDENT_AMBULATORY_CARE_PROVIDER_SITE_OTHER): Payer: Medicare HMO | Admitting: Orthopaedic Surgery

## 2018-10-21 DIAGNOSIS — M545 Low back pain, unspecified: Secondary | ICD-10-CM

## 2018-10-21 DIAGNOSIS — M4807 Spinal stenosis, lumbosacral region: Secondary | ICD-10-CM

## 2018-10-21 MED ORDER — CYCLOBENZAPRINE HCL 10 MG PO TABS: 10.0000 mg | ORAL_TABLET | Freq: Every day | ORAL | 0 refills | Status: DC

## 2018-10-21 NOTE — Progress Notes (Signed)
Office Visit Note   Patient: Samantha Martinez           Date of Birth: 09/18/1950           MRN: [id number] Visit Date: 10/21/2018              Requested by: Samantha Carol, MD [address] [place],  [place] [address] PCP: Samantha Carol, MD   Assessment & Plan: Visit Diagnoses:  1. Bilateral low back pain without sciatica, unspecified chronicity     Plan: Due to the fact the patient's had continued low back pain and the fact that she is having waking pain along with the further decreased to space at L4-5 recommend MRI to rule out HNP as a source of her low back pain.  We will have her follow-up after the MRI to go over results discuss further treatment.  Did give her some Flexeril to take at night.  She is not to take it during the day when driving.  She will continue her home exercise program moist heat to the low back and Tylenol.  Questions encouraged and answered at length.  Follow-Up Instructions: Return for [facility] MRI.   Orders:  Orders Placed This Encounter  Procedures  . XR Lumbar Spine 2-3 Views   Meds ordered this encounter  Medications  . cyclobenzaprine (FLEXERIL) 10 MG tablet    Sig: Take 1 tablet (10 mg total) by mouth at bedtime.    Dispense:  30 tablet    Refill:  0      Procedures: No procedures performed   Clinical Data: No additional findings.   Subjective: Chief Complaint  Patient presents with  . Lower Back - Pain    HPI Samantha Martinez comes in today due to low back pain.  We saw her back in 2017 due to some low back pain.  She states her pain never completely went away but got better with therapy.  However it is became worse over the last 2 months.  She has had no new injury.  She has been doing a home exercise program heat and some over-the-counter patches to her low back.  Pain does awaken her.  Pain seems worse on the right side.  She denies any radicular symptoms.  She has had some urinary incontinence over the  past few months.  She states this is just have a small accident or so when she is heading to the bathroom.  She denies any constipation , diarrhea or bowel incontinence.  She has continued to do her home exercise program but not to the degree she was doing it.  She is working from home and sitting more.  She notes that her low back pain gets worse with eating freshly she eats a significant amount of food.  She has a history of a carcinoid tumor of the terminal ileum for which she underwent a resection of the terminal ileum and cecum with primary anastomosis back in 2009.   Review of Systems  Constitutional: Negative for appetite change, chills, fatigue, fever and unexpected weight change.  Respiratory: Negative for shortness of breath.   Cardiovascular: Negative for chest pain.  Gastrointestinal: Negative for blood in stool and diarrhea.  Genitourinary: Positive for urgency. Negative for dysuria, frequency and hematuria.  Musculoskeletal: Positive for back pain.     Objective: Vital Signs: There were no vitals taken for this visit.  Physical Exam Constitutional:      Appearance: She is not ill-appearing or  diaphoretic.  Cardiovascular:     Pulses: Normal pulses.  Pulmonary:     Effort: Pulmonary effort is normal.  Neurological:     Mental Status: She is alert and oriented to person, place, and time.  Psychiatric:        Mood and Affect: Mood normal.        Behavior: Behavior normal.     Ortho Exam Lower extremity she has 5 out of 5 strength throughout the lower extremities against resistance.  Good range of motion bilateral hips without pain.  Straight leg raise negative bilaterally.  Deep tendon reflexes are 2+ at knees and ankles and equal and symmetric.  Sensation grossly intact bilateral feet to light touch.  Tenderness over the lower lumbar spine on the right and left paraspinous region.  Positive CVA tenderness on the right negative on the left. Specialty Comments:  No  specialty comments available.  Imaging: Xr Lumbar Spine 2-3 Views  Result Date: 10/21/2018 Lumbar spine 2 views compared to films in 2017: Further disc space narrowing at L4-5.  Otherwise no acute fracture.  No spondylolisthesis.  Minimal degenerative changes throughout the spine.  No acute fractures noted.    PMFS History: Patient Active Problem List   Diagnosis Date Noted  . Syncope 01/29/2018  . Diarrhea 09/01/2017  . Weight loss, non-intentional 09/01/2017  . Cervicalgia 04/18/2016  . Vertigo 08/19/2014  . Anxiety disorder 08/19/2014  . Hot flashes 08/19/2014  . Mass of right side of neck 06/17/2014  . right lower back mass 06/30/2012  . Neuroendocrine tumor 11/19/2011   Past Medical History:  Diagnosis Date  . Goiter   . Malignant carcinoid tumor of the ileum [facility])     Family History  Problem Relation Age of Onset  . Cancer Mother        colon cancer    Past Surgical History:  Procedure Laterality Date  . APPENDECTOMY    . COLPOSCOPY    . OVARIAN CYST REMOVAL    . THYROIDECTOMY     Social History   Occupational History  . Not on file  Tobacco Use  . Smoking status: Never Smoker  . Smokeless tobacco: Never Used  Substance and Sexual Activity  . Alcohol use: No  . Drug use: No  . Sexual activity: Not on file

## 2018-11-04 ENCOUNTER — Ambulatory Visit: Payer: Medicare HMO | Admitting: Orthopedic Surgery

## 2018-11-15 ENCOUNTER — Other Ambulatory Visit: Payer: Medicare HMO

## 2018-11-18 DIAGNOSIS — Z01419 Encounter for gynecological examination (general) (routine) without abnormal findings: Secondary | ICD-10-CM | POA: Diagnosis not present

## 2018-11-19 DIAGNOSIS — R109 Unspecified abdominal pain: Secondary | ICD-10-CM | POA: Diagnosis not present

## 2018-11-19 DIAGNOSIS — M545 Low back pain: Secondary | ICD-10-CM | POA: Diagnosis not present

## 2018-11-19 DIAGNOSIS — Z8639 Personal history of other endocrine, nutritional and metabolic disease: Secondary | ICD-10-CM | POA: Diagnosis not present

## 2018-11-20 DIAGNOSIS — L905 Scar conditions and fibrosis of skin: Secondary | ICD-10-CM | POA: Diagnosis not present

## 2018-11-23 ENCOUNTER — Ambulatory Visit: Payer: Medicare HMO | Admitting: Orthopedic Surgery

## 2018-11-23 DIAGNOSIS — R194 Change in bowel habit: Secondary | ICD-10-CM | POA: Diagnosis not present

## 2018-11-23 DIAGNOSIS — R1032 Left lower quadrant pain: Secondary | ICD-10-CM | POA: Diagnosis not present

## 2018-11-23 DIAGNOSIS — M545 Low back pain: Secondary | ICD-10-CM | POA: Diagnosis not present

## 2018-11-26 DIAGNOSIS — M545 Low back pain: Secondary | ICD-10-CM | POA: Diagnosis not present

## 2018-12-09 ENCOUNTER — Ambulatory Visit: Payer: Medicare HMO | Admitting: Orthopaedic Surgery

## 2018-12-09 ENCOUNTER — Encounter: Payer: Self-pay | Admitting: Orthopaedic Surgery

## 2018-12-09 ENCOUNTER — Ambulatory Visit (INDEPENDENT_AMBULATORY_CARE_PROVIDER_SITE_OTHER): Payer: Medicare HMO | Admitting: Orthopaedic Surgery

## 2018-12-09 DIAGNOSIS — M5136 Other intervertebral disc degeneration, lumbar region: Secondary | ICD-10-CM | POA: Insufficient documentation

## 2018-12-09 NOTE — Progress Notes (Signed)
Office Visit Note   Patient: Samantha Martinez           Date of Birth: [birth year]           MRN: [id number] Visit Date: 12/09/2018              Requested by: Seward Carol, MD [address] [place],  [place] [address] PCP: Seward Carol, MD   Assessment & Plan: Visit Diagnoses:  1. Other intervertebral disc degeneration, lumbar region         Most pronounced at L4-5 with 3 mm anterolisthesis without neurogenic claudication.  Plan: MRI scan is reviewed with patient as well as previous CT scan done 08/30/2017 which showed degenerative endplate changes and anterolisthesis at L4-5.  We discussed options including epidural injections versus fusion.  She can call if she like to try the epidural injection at the L4-5 level.  If she develops claudication symptoms she can return.  I plan to recheck her in 3 months.  Follow-Up Instructions: Return in about 3 months (around 03/11/2019).   Orders:  No orders of the defined types were placed in this encounter.  No orders of the defined types were placed in this encounter.     Procedures: No procedures performed   Clinical Data: No additional findings.   Subjective: Chief Complaint  Patient presents with  . Lower Back - Pain, Follow-up    MRI Lumbar Review    HPI 68 year old female seen with chronic back pain with recent MRI 11/27/2018 showing discogenic changes facet arthritis with endplate irregularity at L4-5 3 mm anterolisthesis without significant central stenosis.  Patient has some edematous endplate changes at [id number] and also L1-2 level.Hx of LBP since 2017.  5 month Hx of occasional urinary urgency and incontinence. Hx of cancinoid tumor resection 2009. Terminal ileum.  She denies claudication symptoms.  Recent treatment by GI doctor with prednisone pack gave her improvement in symptoms for short period of time.  Review of Systems 14 pt system update unchanged  from 10/21/2018 office visit other than as  mentioned in HPI.   Objective: Vital Signs: BP (!) 141/90   Pulse 79   Ht 5\' 3"  (1.6 m)   Wt 173 lb (78.5 kg)   BMI 30.65 kg/m   Physical Exam Constitutional:      Appearance: She is well-developed.  HENT:     Head: Normocephalic.     Right Ear: External ear normal.     Left Ear: External ear normal.  Eyes:     Pupils: Pupils are equal, round, and reactive to light.  Neck:     Thyroid: No thyromegaly.     Trachea: No tracheal deviation.  Cardiovascular:     Rate and Rhythm: Normal rate.  Pulmonary:     Effort: Pulmonary effort is normal.  Abdominal:     Palpations: Abdomen is soft.  Skin:    General: Skin is warm and dry.  Neurological:     Mental Status: She is alert and oriented to person, place, and time.  Psychiatric:        Behavior: Behavior normal.     Ortho Exam good strength lower extremities 2+ ankle jerks negative logroll to the hips knees reach full extension EHL is strong.  No sensory deficit the lower extremities.  She has tenderness over the sciatic notch right and left tenderness palpation of the lumbar spine.  Specialty Comments:  No specialty comments available.  Imaging: No results found.   [place]  History: Patient Active Problem List   Diagnosis Date Noted  . Other intervertebral disc degeneration, lumbar region 12/09/2018  . Syncope 01/29/2018  . Diarrhea 09/01/2017  . Weight loss, non-intentional 09/01/2017  . Cervicalgia 04/18/2016  . Vertigo 08/19/2014  . Anxiety disorder 08/19/2014  . Hot flashes 08/19/2014  . Mass of right side of neck 06/17/2014  . right lower back mass 06/30/2012  . Neuroendocrine tumor 11/19/2011   Past Medical History:  Diagnosis Date  . Goiter   . Malignant carcinoid tumor of the ileum [facility])     Family History  Problem Relation Age of Onset  . Cancer Mother        colon cancer    Past Surgical History:  Procedure Laterality Date  . APPENDECTOMY    . COLPOSCOPY    . OVARIAN CYST REMOVAL    .  THYROIDECTOMY     Social History   Occupational History  . Not on file  Tobacco Use  . Smoking status: Never Smoker  . Smokeless tobacco: Never Used  Substance and Sexual Activity  . Alcohol use: No  . Drug use: No  . Sexual activity: Not on file

## 2018-12-28 DIAGNOSIS — Z1211 Encounter for screening for malignant neoplasm of colon: Secondary | ICD-10-CM | POA: Diagnosis not present

## 2018-12-28 DIAGNOSIS — M545 Low back pain: Secondary | ICD-10-CM | POA: Diagnosis not present

## 2019-01-13 DIAGNOSIS — R69 Illness, unspecified: Secondary | ICD-10-CM | POA: Diagnosis not present

## 2019-01-20 ENCOUNTER — Other Ambulatory Visit: Payer: Self-pay

## 2019-01-20 DIAGNOSIS — Z20828 Contact with and (suspected) exposure to other viral communicable diseases: Secondary | ICD-10-CM | POA: Diagnosis not present

## 2019-01-20 DIAGNOSIS — Z20822 Contact with and (suspected) exposure to covid-19: Secondary | ICD-10-CM

## 2019-01-21 LAB — NOVEL CORONAVIRUS, NAA: SARS-CoV-2, NAA: NOT DETECTED

## 2019-02-15 ENCOUNTER — Telehealth: Payer: Self-pay | Admitting: *Deleted

## 2019-02-15 NOTE — Telephone Encounter (Addendum)
"  Samantha Martinez [phone]).  Concerned and need to see Dr. Alvy Bimler, have labs or something.  Noticed dark, bright, burgundy-red blood dripping from my rectum last night after bowel movement.    Today blood was on toilet paper after bowel movement.    No hemorrhoids or constipation.  Bowels move daily and are never hard.  Experiencing twenty to thirty second sharp, grabbing pain, comes and goes up to four times daily to upper back and right side.  Rate as six or seven on pain scale.  Not used any OTC medications, I just deal with it.  No abdominal cramps, swelling or bloating.  Dr. Benson Norway rescheduled colonoscopy to November 19th; the office had power outage while I was there last Thursday."

## 2019-02-15 NOTE — Telephone Encounter (Signed)
She needs GI evaluation

## 2019-02-15 NOTE — Telephone Encounter (Signed)
Spoke with Samantha Martinez .  Dr. Alvy Bimler information to obtain GI evaluation provided with this call.  Currently no further questions or needs at this time.

## 2019-02-16 ENCOUNTER — Other Ambulatory Visit: Payer: Self-pay

## 2019-02-16 ENCOUNTER — Ambulatory Visit: Payer: Medicare HMO | Admitting: Orthopaedic Surgery

## 2019-02-16 ENCOUNTER — Encounter: Payer: Self-pay | Admitting: Orthopaedic Surgery

## 2019-02-16 VITALS — BP 161/99 | HR 82 | Ht 63.5 in | Wt 173.0 lb

## 2019-02-16 DIAGNOSIS — Z20822 Contact with and (suspected) exposure to covid-19: Secondary | ICD-10-CM

## 2019-02-16 DIAGNOSIS — M5136 Other intervertebral disc degeneration, lumbar region: Secondary | ICD-10-CM

## 2019-02-16 NOTE — Progress Notes (Signed)
Office Visit Note   Patient: Samantha Martinez           Date of Birth: 05/31/1950           MRN: [id number] Visit Date: 02/16/2019              Requested by: Seward Carol, MD [address] [place],  [place] [address] PCP: Seward Carol, MD   Assessment & Plan: Visit Diagnoses:  1. Other intervertebral disc degeneration, lumbar region     Plan: Discussed that she has some disc space collapse with anterolisthesis at the L4-5 level with stenosis.  We will set up for some physical therapy she states she is not interested in any surgical options at this point until her symptoms get worse.  We will set her up for therapy for core strengthening and I will recheck her in 3 to 4 months.  Follow-Up Instructions: Return in about 3 months (around 05/19/2019).   Orders:  No orders of the defined types were placed in this encounter.  No orders of the defined types were placed in this encounter.     Procedures: No procedures performed   Clinical Data: No additional findings.   Subjective: Chief Complaint  Patient presents with  . Lower Back - Pain    HPI 68 year old female returns with ongoing problems with back pain.  She has problems after she has been sitting trying to get to an upright position.  She does better if she leans forward.  She does better when she is in the grocery store and leans over a grocery cart.  She states she can walk a few blocks.  She has more pain left paralumbar than right.  She sits in a chair all day and states after 1 or 2 hours in the chair return to get up and walk.  After period time she straightens up to some degree.  She has some trouble sleeping at night particularly on her side does better on her back.  Tylenol has been used with some relief.  Muscle relaxants tend to make her drowsy no bowel bladder symptoms.  MRI scan in August 2020 lumbar done at [facility] showed 3 mm anterolisthesis at L4-5 with facet sclerosis of the joints  and facet edema.  She has not had lateral flexion-extension x-rays.  Review of Systems reviewed updated unchanged from 12/09/2018 office visit.   Objective: Vital Signs: BP (!) 161/99   Pulse 82   Ht 5' 3.5" (1.613 m)   Wt 173 lb (78.5 kg)   BMI 30.16 kg/m   Physical Exam Constitutional:      Appearance: She is well-developed.  HENT:     Head: Normocephalic.     Right Ear: External ear normal.     Left Ear: External ear normal.  Eyes:     Pupils: Pupils are equal, round, and reactive to light.  Neck:     Thyroid: No thyromegaly.     Trachea: No tracheal deviation.  Cardiovascular:     Rate and Rhythm: Normal rate.  Pulmonary:     Effort: Pulmonary effort is normal.  Abdominal:     Palpations: Abdomen is soft.  Skin:    General: Skin is warm and dry.  Neurological:     Mental Status: She is alert and oriented to person, place, and time.  Psychiatric:        Behavior: Behavior normal.     Ortho Exam patient has a pain with straight leg  raising both right and left at 90 degrees.  Minimal trochanteric bursal tenderness negative logroll of the hips knees reach full extension.  Minimal sciatic notch tenderness left and right no gluteal atrophy.  Some tenderness paralumbar at the L4-5 region worse on the left than right.  She is able to heel and toe walk anterior tib EHL is strong.  No gastrocsoleus atrophy distal pulses are intact.  Specialty Comments:  No specialty comments available.  Imaging: No results found.   PMFS History: Patient Active Problem List   Diagnosis Date Noted  . Other intervertebral disc degeneration, lumbar region 12/09/2018  . Syncope 01/29/2018  . Diarrhea 09/01/2017  . Weight loss, non-intentional 09/01/2017  . Cervicalgia 04/18/2016  . Vertigo 08/19/2014  . Anxiety disorder 08/19/2014  . Hot flashes 08/19/2014  . Mass of right side of neck 06/17/2014  . right lower back mass 06/30/2012  . Neuroendocrine tumor 11/19/2011   Past Medical  History:  Diagnosis Date  . Goiter   . Malignant carcinoid tumor of the ileum [facility])     Family History  Problem Relation Age of Onset  . Cancer Mother        colon cancer    Past Surgical History:  Procedure Laterality Date  . APPENDECTOMY    . COLPOSCOPY    . OVARIAN CYST REMOVAL    . THYROIDECTOMY     Social History   Occupational History  . Not on file  Tobacco Use  . Smoking status: Never Smoker  . Smokeless tobacco: Never Used  Substance and Sexual Activity  . Alcohol use: No  . Drug use: No  . Sexual activity: Not on file

## 2019-02-18 ENCOUNTER — Encounter: Payer: Self-pay | Admitting: Physical Therapy

## 2019-02-18 ENCOUNTER — Ambulatory Visit: Payer: Medicare HMO | Admitting: Physical Therapy

## 2019-02-18 ENCOUNTER — Other Ambulatory Visit: Payer: Self-pay

## 2019-02-18 DIAGNOSIS — R293 Abnormal posture: Secondary | ICD-10-CM | POA: Diagnosis not present

## 2019-02-18 DIAGNOSIS — M545 Low back pain, unspecified: Secondary | ICD-10-CM

## 2019-02-18 DIAGNOSIS — G8929 Other chronic pain: Secondary | ICD-10-CM

## 2019-02-18 DIAGNOSIS — M6281 Muscle weakness (generalized): Secondary | ICD-10-CM | POA: Diagnosis not present

## 2019-02-18 LAB — NOVEL CORONAVIRUS, NAA: SARS-CoV-2, NAA: NOT DETECTED

## 2019-02-18 NOTE — Patient Instructions (Signed)
Access Code: [email or web address]  URL: [email or web address]  Date: 02/18/2019  Prepared by: Faustino Congress   Exercises  Hooklying Single Knee to Chest - 3 reps - 1 sets - 30 sec hold - 2x daily - 7x weekly  Supine Piriformis Stretch with Foot on Ground - 3 reps - 1 sets - 30 sec hold - 2x daily - 7x weekly  Hooklying Hamstring Stretch with Strap - 10 reps - 3 sets - 1x daily - 7x weekly  Supine Posterior Pelvic Tilt - 10 reps - 1 sets - 5 sec hold - 2x daily - 7x weekly  Patient Education  Trigger Point Dry Needling

## 2019-02-18 NOTE — Therapy (Signed)
[place], [place], [address] Phone: [phone]   Fax:  [phone]  Physical Therapy Evaluation  Patient Details  Name: Samantha Martinez MRN: [id number] Date of Birth: 02-Apr-1951 Referring Provider (PT): Marybelle Killings, MD   Encounter Date: 02/18/2019  PT End of Session - 02/18/19 1400    Visit Number  1    Number of Visits  12    Date for PT Re-Evaluation  04/01/19    PT Start Time  1312    PT Stop Time  1357    PT Time Calculation (min)  45 min    Activity Tolerance  Patient tolerated treatment well    Behavior During Therapy  [facility] for tasks assessed/performed       Past Medical History:  Diagnosis Date  . Goiter   . Malignant carcinoid tumor of the ileum [facility])     Past Surgical History:  Procedure Laterality Date  . APPENDECTOMY    . COLPOSCOPY    . OVARIAN CYST REMOVAL    . THYROIDECTOMY      There were no vitals filed for this visit.   Subjective Assessment - 02/18/19 1314    Subjective  Pt is a 69 y/o female who presents to OPPT for LBP x 18 months.  Pt without known onset.  Pt reports difficulty with all movement, and pain worse after prolonged static positioning.  Pt has flexion preference which helps pain.    Limitations  Sitting;Standing;Walking    How long can you sit comfortably?  15 min    How long can you stand comfortably?  tries not to stand in one place    Diagnostic tests  x rays: arthritis; L4-5 degenerative spondylolisthesis    Patient Stated Goals  improve pain    Currently in Pain?  Yes    Pain Score  5    at best 2/10; up to 9.5/10   Pain Location  Back    Pain Orientation  Left;Lower;Right    Pain Descriptors / Indicators  Pressure;Sharp;Shooting    Pain Type  Chronic pain    Pain Onset  More than a month ago    Pain Frequency  Constant    Aggravating Factors   prolonged static positioning    Pain Relieving Factors  flexion exercises         [facility] PT Assessment -  02/18/19 1324      Assessment   Medical Diagnosis  M51.36 (ICD-10-CM) - Other intervertebral disc degeneration, lumbar region    Referring Provider (PT)  Marybelle Killings, MD    Onset Date/Surgical Date  --   18 months   Hand Dominance  Right    Next MD Visit  3 months    Prior Therapy  for neck pain ~ 3 years      Precautions   Precautions  None      Restrictions   Weight Bearing Restrictions  No      Balance Screen   Has the patient fallen in the past 6 months  No    Has the patient had a decrease in activity level because of a fear of falling?   No    Is the patient reluctant to leave their home because of a fear of falling?   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; computer work all day    Leisure  watch TV, no regular exercise      Cognition   Overall Cognitive Status  Within Functional Limits for tasks assessed      Posture/Postural Control   Posture/Postural Control  Postural limitations    Postural Limitations  Rounded Shoulders;Forward head      ROM / Strength   AROM / PROM / Strength  AROM;Strength      AROM   Overall AROM Comments  no change in pain with repeated motion    AROM Assessment Site  Lumbar    Lumbar Flexion  WNL    Lumbar Extension  WNL    Lumbar - Right Side Bend  WNL    Lumbar - Left Side Bend  WNL      Strength   Strength Assessment Site  Hip;Knee;Ankle    Right/Left Hip  Right;Left    Right Hip Flexion  4/5    Right Hip Extension  3/5    Right Hip ABduction  3+/5    Left Hip Flexion  4/5    Left Hip Extension  3/5    Left Hip ABduction   3+/5    Right/Left Knee  Right;Left    Right Knee Extension  5/5    Left Knee Extension  4/5    Right/Left Ankle  Right;Left    Right Ankle Dorsiflexion  5/5    Left Ankle Dorsiflexion  5/5      Flexibility   Soft Tissue Assessment /Muscle Length  yes    Hamstrings  tightness bil    Piriformis  tightness bil    Quadratus Lumborum  trigger points noted in Lt      Palpation   SI assessment   Lt PSIS ant rotated    Palpation comment  tender to light palpation along lumbar paraspinals, trigger points noted into glute med and Lt QL      Special Tests    Special Tests  Lumbar    Lumbar Tests  Slump Test      Slump test   Findings  Negative                Objective measurements completed on examination: See above findings.      [place] Adult PT Treatment/Exercise - 02/18/19 1324      Exercises   Exercises  Lumbar      Lumbar Exercises: Stretches   Passive Hamstring Stretch  Right;1 rep;30 seconds    Single Knee to Chest Stretch  Right;Left;1 rep;30 seconds    Piriformis Stretch  Right;Left;1 rep;30 seconds      Lumbar Exercises: Supine   Pelvic Tilt  5 reps;5 seconds             PT Education - 02/18/19 1400    Education Details  HEP, DN    Person(s) Educated  Patient    Methods  Explanation;Demonstration;Handout    Comprehension  Verbalized understanding;Returned demonstration;Need further instruction          PT Long Term Goals - 02/18/19 1521      PT  LONG TERM GOAL #1   Title  independent with HEP    Status  New    [place] Date  04/01/19      PT LONG TERM GOAL #2   Title  report pain < 5/10 after full day of work for improved function    Status  New    Target Date  04/01/19      PT LONG TERM GOAL #3   Title  demonstrate 4/5 hip strength for improved function    Status  New    Target Date  04/01/19      PT LONG TERM GOAL #4   Title  report 75% improvement in pain/mobility during the night for improved mobility    Status  New    Target  Date  04/01/19             Plan - 02/18/19 1400    Clinical Impression Statement  Pt is a 68 y/o female who presents to OPPT for chronic LBP.  Pt demonstrates decreased flexibility, decreased strength and muscular tightness affecting functional mobiltiy.  Pt will benefit from PT to address deficits listed.    Personal Factors and Comorbidities  Comorbidity 2    Comorbidities  malignant tumor of ilium, anxiety    Examination-Activity Limitations  Stand;Sit;Locomotion Level;Lift    Examination-Participation Restrictions  Other   occupation   Stability/Clinical Decision Making  Stable/Uncomplicated    Clinical Decision Making  Low    Rehab Potential  Good    PT Frequency  2x / week    PT Duration  6 weeks    PT Treatment/Interventions  ADLs/Self Care Home Management;Cryotherapy;Electrical Stimulation;Ultrasound;Traction;Moist Heat;Stair training;Functional mobility training;Therapeutic activities;Therapeutic exercise;Patient/family education;Neuromuscular re-education;Manual techniques;Taping;Dry needling    PT Next Visit Plan  review HEP, possible DN to QL and glutes, manual/modalities PRN, progress core/hip strengthening    PT Home Exercise Plan  Access Code: [email or web address]    Consulted and Agree with Plan of Care  Patient       Patient will benefit from skilled therapeutic intervention in order to improve the following deficits and impairments:  Increased fascial restricitons, Increased muscle spasms, Pain, Postural dysfunction, Decreased mobility, Decreased strength, Impaired flexibility  Visit Diagnosis: Chronic midline low back pain without sciatica - Plan: PT plan of care cert/re-cert  Abnormal posture - Plan: PT plan of care cert/re-cert  Muscle weakness (generalized) - Plan: PT plan of care cert/re-cert     Problem List Patient Active Problem List   Diagnosis Date Noted  . Other intervertebral disc degeneration, lumbar region 12/09/2018  . Syncope 01/29/2018  . Diarrhea  09/01/2017  . Weight loss, non-intentional 09/01/2017  . Cervicalgia 04/18/2016  . Vertigo 08/19/2014  . Anxiety disorder 08/19/2014  . Hot flashes 08/19/2014  . Mass of right side of neck 06/17/2014  . right lower back mass 06/30/2012  . Neuroendocrine tumor 11/19/2011      Laureen Abrahams, PT, DPT 02/18/19 3:24 PM      [facility] Physical Therapy [address] [place], [place], [address] Phone: [phone]   Fax:  [phone]  Name: Samantha Martinez MRN: [id number] Date of Birth: 1950/07/24

## 2019-02-22 DIAGNOSIS — R69 Illness, unspecified: Secondary | ICD-10-CM | POA: Diagnosis not present

## 2019-02-22 DIAGNOSIS — M545 Low back pain: Secondary | ICD-10-CM | POA: Diagnosis not present

## 2019-02-22 DIAGNOSIS — G4700 Insomnia, unspecified: Secondary | ICD-10-CM | POA: Diagnosis not present

## 2019-02-23 ENCOUNTER — Other Ambulatory Visit: Payer: Self-pay

## 2019-02-23 ENCOUNTER — Ambulatory Visit (INDEPENDENT_AMBULATORY_CARE_PROVIDER_SITE_OTHER): Payer: Medicare HMO | Admitting: Physical Therapy

## 2019-02-23 ENCOUNTER — Encounter: Payer: Self-pay | Admitting: Physical Therapy

## 2019-02-23 DIAGNOSIS — M545 Low back pain, unspecified: Secondary | ICD-10-CM

## 2019-02-23 DIAGNOSIS — R293 Abnormal posture: Secondary | ICD-10-CM

## 2019-02-23 DIAGNOSIS — M6281 Muscle weakness (generalized): Secondary | ICD-10-CM | POA: Diagnosis not present

## 2019-02-23 DIAGNOSIS — G8929 Other chronic pain: Secondary | ICD-10-CM

## 2019-02-23 NOTE — Therapy (Signed)
[place], [place], [address] Phone: [phone]   Fax:  [phone]  Physical Therapy Treatment  Patient Details  Name: Samantha Martinez MRN: [id number] Date of Birth: 1950-08-24 Referring Provider (PT): Marybelle Killings, MD   Encounter Date: 02/23/2019  PT End of Session - 02/23/19 1357    Visit Number  2    Number of Visits  12    Date for PT Re-Evaluation  04/01/19    PT Start Time  3546    PT Stop Time  1357    PT Time Calculation (min)  39 min    Activity Tolerance  Patient tolerated treatment well    Behavior During Therapy  [facility] for tasks assessed/performed       Past Medical History:  Diagnosis Date  . Goiter   . Malignant carcinoid tumor of the ileum [facility])     Past Surgical History:  Procedure Laterality Date  . APPENDECTOMY    . COLPOSCOPY    . OVARIAN CYST REMOVAL    . THYROIDECTOMY      There were no vitals filed for this visit.  Subjective Assessment - 02/23/19 1320    Subjective  doing well, back is a little better.  still having pain but less frequent.    Limitations  Sitting;Standing;Walking    How long can you sit comfortably?  15 min    How long can you stand comfortably?  tries not to stand in one place    Diagnostic tests  x rays: arthritis; L4-5 degenerative spondylolisthesis    Patient Stated Goals  improve pain    Currently in Pain?  Yes    Pain Score  4    "I don't feel it right now."   Pain Location  Back    Pain Orientation  Right;Left;Lower    Pain Descriptors / Indicators  Pressure;Sharp;Shooting    Pain Type  Chronic pain    Pain Onset  More than a month ago    Pain Frequency  Constant    Aggravating Factors   prolonged static positioning    Pain Relieving Factors  flexion exercises                       [facility] Adult PT Treatment/Exercise - 02/23/19 1322      Lumbar Exercises: Stretches   Passive Hamstring Stretch  Right;Left;3 reps;30 seconds     Single Knee to Chest Stretch  Right;Left;3 reps;30 seconds    Piriformis Stretch  Right;Left;3 reps;30 seconds      Lumbar Exercises: Aerobic   Recumbent Bike  L2 x 6 min; seat 4      Lumbar Exercises: Supine   Pelvic Tilt  10 reps;5 seconds    Clam  10 reps    Clam Limitations  with Level 1 theraband; core engagement    Bent Knee Raise  10 reps    Bent Knee Raise Limitations  with Level 1 theraband; core engaged    Bridge  10 reps;5 seconds             PT Education - 02/23/19 1357    Education Details  HEP    Person(s) Educated  Patient    Methods  Explanation;Demonstration;Handout    Comprehension  Verbalized understanding;Returned demonstration;Need further instruction          PT Long Term Goals - 02/18/19 1521      PT LONG TERM GOAL #1   Title  independent with  HEP    Status  New    Target Date  04/01/19      PT LONG TERM GOAL #2   Title  report pain < 5/10 after full day of work for improved function    Status  New    Target Date  04/01/19      PT LONG TERM GOAL #3   Title  demonstrate 4/5 hip strength for improved function    Status  New    Target Date  04/01/19      PT LONG TERM GOAL #4   Title  report 75% improvement in pain/mobility during the night for improved mobility    Status  New    Target Date  04/01/19            Plan - 02/23/19 1357    Clinical Impression Statement  Pt tolerated session well with cues for proper technique with exercises.  No goals met as only 2nd visit, but pt reporting decreased freq in pain.    Personal Factors and Comorbidities  Comorbidity 2    Comorbidities  malignant tumor of ilium, anxiety    Examination-Activity Limitations  Stand;Sit;Locomotion Level;Lift    Examination-Participation Restrictions  Other   occupation   Stability/Clinical Decision Making  Stable/Uncomplicated    Rehab Potential  Good    PT Frequency  2x / week    PT Duration  6 weeks    PT Treatment/Interventions  ADLs/Self Care  Home Management;Cryotherapy;Electrical Stimulation;Ultrasound;Traction;Moist Heat;Stair training;Functional mobility training;Therapeutic activities;Therapeutic exercise;Patient/family education;Neuromuscular re-education;Manual techniques;Taping;Dry needling    PT Next Visit Plan  review HEP, possible DN to QL and glutes, manual/modalities PRN, progress core/hip strengthening    PT Home Exercise Plan  Access Code: [email or web address]    Consulted and Agree with Plan of Care  Patient       Patient will benefit from skilled therapeutic intervention in order to improve the following deficits and impairments:  Increased fascial restricitons, Increased muscle spasms, Pain, Postural dysfunction, Decreased mobility, Decreased strength, Impaired flexibility  Visit Diagnosis: Chronic midline low back pain without sciatica  Abnormal posture  Muscle weakness (generalized)     Problem List Patient Active Problem List   Diagnosis Date Noted  . Other intervertebral disc degeneration, lumbar region 12/09/2018  . Syncope 01/29/2018  . Diarrhea 09/01/2017  . Weight loss, non-intentional 09/01/2017  . Cervicalgia 04/18/2016  . Vertigo 08/19/2014  . Anxiety disorder 08/19/2014  . Hot flashes 08/19/2014  . Mass of right side of neck 06/17/2014  . right lower back mass 06/30/2012  . Neuroendocrine tumor 11/19/2011      Laureen Abrahams, PT, DPT 02/23/19 1:59 PM     [facility] [address] [place], [place], [address] Phone: [phone]   Fax:  [phone]  Name: Samantha Martinez MRN: [id number] Date of Birth: 1951-02-16

## 2019-02-23 NOTE — Patient Instructions (Signed)
Access Code: [email or web address]  URL: [email or web address]  Date: 02/23/2019  Prepared by: Faustino Congress   Exercises  Hooklying Single Knee to Chest - 3 reps - 1 sets - 30 sec hold - 2x daily - 7x weekly  Supine Piriformis Stretch with Foot on Ground - 3 reps - 1 sets - 30 sec hold - 2x daily - 7x weekly  Hooklying Hamstring Stretch with Strap - 10 reps - 3 sets - 1x daily - 7x weekly  Supine Posterior Pelvic Tilt - 10 reps - 1 sets - 5 sec hold - 2x daily - 7x weekly  Hooklying Clamshell with Resistance - 10 reps - 1 sets - 2x daily - 7x weekly  Supine March - 10 reps - 1 sets - 2x daily - 7x weekly  Patient Education  Trigger Point Dry Needling

## 2019-02-25 ENCOUNTER — Ambulatory Visit (INDEPENDENT_AMBULATORY_CARE_PROVIDER_SITE_OTHER): Payer: Medicare HMO | Admitting: Physical Therapy

## 2019-02-25 ENCOUNTER — Encounter: Payer: Self-pay | Admitting: Physical Therapy

## 2019-02-25 ENCOUNTER — Other Ambulatory Visit: Payer: Self-pay

## 2019-02-25 DIAGNOSIS — R293 Abnormal posture: Secondary | ICD-10-CM | POA: Diagnosis not present

## 2019-02-25 DIAGNOSIS — M6281 Muscle weakness (generalized): Secondary | ICD-10-CM

## 2019-02-25 DIAGNOSIS — M545 Low back pain, unspecified: Secondary | ICD-10-CM

## 2019-02-25 DIAGNOSIS — G8929 Other chronic pain: Secondary | ICD-10-CM

## 2019-02-25 NOTE — Therapy (Signed)
[place], [place], [address] Phone: [phone]   Fax:  [phone]  Physical Therapy Treatment  Patient Details  Name: Samantha Martinez MRN: [id number] Date of Birth: 02/22/1951 Referring Provider (PT): Marybelle Killings, MD   Encounter Date: 02/25/2019  PT End of Session - 02/25/19 1547    Visit Number  3    Number of Visits  12    Date for PT Re-Evaluation  04/01/19    PT Start Time  1503    PT Stop Time  1542    PT Time Calculation (min)  39 min    Activity Tolerance  Patient tolerated treatment well    Behavior During Therapy  [facility] for tasks assessed/performed       Past Medical History:  Diagnosis Date  . Goiter   . Malignant carcinoid tumor of the ileum [facility])     Past Surgical History:  Procedure Laterality Date  . APPENDECTOMY    . COLPOSCOPY    . OVARIAN CYST REMOVAL    . THYROIDECTOMY      There were no vitals filed for this visit.  Subjective Assessment - 02/25/19 1504    Subjective  tired today; willing to try DN today.    Limitations  Sitting;Standing;Walking    How long can you sit comfortably?  15 min    How long can you stand comfortably?  tries not to stand in one place    Diagnostic tests  x rays: arthritis; L4-5 degenerative spondylolisthesis    Patient Stated Goals  improve pain    Currently in Pain?  --   did not rate today   Pain Onset  More than a month ago                       [facility] Adult PT Treatment/Exercise - 02/25/19 1510      Lumbar Exercises: Stretches   Single Knee to Chest Stretch  Right;Left;1 rep;30 seconds    Single Knee to Chest Stretch Limitations  after DN, decreased tightness on Rt    Piriformis Stretch  Right;Left;1 rep;30 seconds    Piriformis Stretch Limitations  after DN, decreased tightness on Rt      Lumbar Exercises: Aerobic   Nustep  L5 x 6 min      Lumbar Exercises: Supine   Pelvic Tilt  10 reps;5 seconds      Manual  Therapy   Manual Therapy  Soft tissue mobilization    Manual therapy comments  skilled monitoring and palpation of soft tissue during DN    Soft tissue mobilization  IASTM to Rt glutes       Trigger Point Dry Needling - 02/25/19 1538    Consent Given?  Yes    Education Handout Provided  Previously provided    Muscles Treated Back/Hip  Gluteus minimus;Gluteus medius    Gluteus Minimus Response  Palpable increased muscle length    Gluteus Medius Response  Twitch response elicited;Palpable increased muscle length                PT Long Term Goals - 02/18/19 1521      PT LONG TERM GOAL #1   Title  independent with HEP    Status  New    Target Date  04/01/19      PT LONG TERM GOAL #2   Title  report pain < 5/10 after full day of work for improved function    Status  New    Target Date  04/01/19      PT LONG TERM GOAL #3   Title  demonstrate 4/5 hip strength for improved function    Status  New    Target Date  04/01/19      PT LONG TERM GOAL #4   Title  report 75% improvement in pain/mobility during the night for improved mobility    Status  New    Target Date  04/01/19            Plan - 02/25/19 1547    Clinical Impression Statement  Pt with decreased tightness today following DN and manual therapy on Rt side.  May benefit from Lt side DN next session.  Slowly progressing with PT.    Personal Factors and Comorbidities  Comorbidity 2    Comorbidities  malignant tumor of ilium, anxiety    Examination-Activity Limitations  Stand;Sit;Locomotion Level;Lift    Examination-Participation Restrictions  Other   occupation   Stability/Clinical Decision Making  Stable/Uncomplicated    Rehab Potential  Good    PT Frequency  2x / week    PT Duration  6 weeks    PT Treatment/Interventions  ADLs/Self Care Home Management;Cryotherapy;Electrical Stimulation;Ultrasound;Traction;Moist Heat;Stair training;Functional mobility training;Therapeutic activities;Therapeutic  exercise;Patient/family education;Neuromuscular re-education;Manual techniques;Taping;Dry needling    PT Next Visit Plan  review HEP, possible DN to QL and glutes, manual/modalities PRN, progress core/hip strengthening    PT Home Exercise Plan  Access Code: [email or web address]    Consulted and Agree with Plan of Care  Patient       Patient will benefit from skilled therapeutic intervention in order to improve the following deficits and impairments:  Increased fascial restricitons, Increased muscle spasms, Pain, Postural dysfunction, Decreased mobility, Decreased strength, Impaired flexibility  Visit Diagnosis: Chronic midline low back pain without sciatica  Abnormal posture  Muscle weakness (generalized)     Problem List Patient Active Problem List   Diagnosis Date Noted  . Other intervertebral disc degeneration, lumbar region 12/09/2018  . Syncope 01/29/2018  . Diarrhea 09/01/2017  . Weight loss, non-intentional 09/01/2017  . Cervicalgia 04/18/2016  . Vertigo 08/19/2014  . Anxiety disorder 08/19/2014  . Hot flashes 08/19/2014  . Mass of right side of neck 06/17/2014  . right lower back mass 06/30/2012  . Neuroendocrine tumor 11/19/2011      Laureen Abrahams, PT, DPT 02/25/19 3:49 PM     [facility] [address] [place], [place], [address] Phone: [phone]   Fax:  [phone]  Name: Samantha Martinez MRN: [id number] Date of Birth: 1951/03/16

## 2019-03-02 ENCOUNTER — Encounter: Payer: Self-pay | Admitting: Physical Therapy

## 2019-03-02 ENCOUNTER — Ambulatory Visit (INDEPENDENT_AMBULATORY_CARE_PROVIDER_SITE_OTHER): Payer: Medicare HMO | Admitting: Physical Therapy

## 2019-03-02 ENCOUNTER — Other Ambulatory Visit: Payer: Self-pay

## 2019-03-02 DIAGNOSIS — M6281 Muscle weakness (generalized): Secondary | ICD-10-CM | POA: Diagnosis not present

## 2019-03-02 DIAGNOSIS — M545 Low back pain, unspecified: Secondary | ICD-10-CM

## 2019-03-02 DIAGNOSIS — G8929 Other chronic pain: Secondary | ICD-10-CM

## 2019-03-02 DIAGNOSIS — R293 Abnormal posture: Secondary | ICD-10-CM | POA: Diagnosis not present

## 2019-03-02 NOTE — Therapy (Signed)
[place], [place], [address] Phone: [phone]   Fax:  [phone]  Physical Therapy Treatment  Patient Details  Name: Samantha Martinez MRN: [id number] Date of Birth: Sep 10, 1950 Referring Provider (PT): Marybelle Killings, MD   Encounter Date: 03/02/2019  PT End of Session - 03/02/19 1355    Visit Number  4    Number of Visits  12    Date for PT Re-Evaluation  04/01/19    PT Start Time  [id number]    PT Stop Time  1555    PT Time Calculation (min)  40 min    Activity Tolerance  Patient tolerated treatment well    Behavior During Therapy  [facility] for tasks assessed/performed       Past Medical History:  Diagnosis Date  . Goiter   . Malignant carcinoid tumor of the ileum [facility])     Past Surgical History:  Procedure Laterality Date  . APPENDECTOMY    . COLPOSCOPY    . OVARIAN CYST REMOVAL    . THYROIDECTOMY      There were no vitals filed for this visit.  Subjective Assessment - 03/02/19 1316    Subjective  Rt glute feels much better, wants DN on Lt side today.    Limitations  Sitting;Standing;Walking    How long can you sit comfortably?  15 min    How long can you stand comfortably?  tries not to stand in one place    Diagnostic tests  x rays: arthritis; L4-5 degenerative spondylolisthesis    Patient Stated Goals  improve pain    Currently in Pain?  --   did not rate or report today   Pain Onset  More than a month ago                       [facility] Adult PT Treatment/Exercise - 03/02/19 1316      Lumbar Exercises: Stretches   Single Knee to Chest Stretch  Right;Left;2 reps;30 seconds    Piriformis Stretch  Right;Left;2 reps;30 seconds      Lumbar Exercises: Aerobic   Nustep  L5 x 6 min      Lumbar Exercises: Supine   Pelvic Tilt  10 reps;5 seconds    Bridge  10 reps;5 seconds    Other Supine Lumbar Exercises  mini crunch x10 reps; cues for core engagement      Manual Therapy   Manual  Therapy  Soft tissue mobilization    Manual therapy comments  skilled monitoring and palpation of soft tissue during DN    Soft tissue mobilization  IASTM to Lt glutes       Trigger Point Dry Needling - 03/02/19 1356    Consent Given?  Yes    Education Handout Provided  Previously provided    Muscles Treated Back/Hip  Gluteus minimus;Gluteus medius    Gluteus Minimus Response  Palpable increased muscle length    Gluteus Medius Response  Twitch response elicited;Palpable increased muscle length                PT Long Term Goals - 02/18/19 1521      PT LONG TERM GOAL #1   Title  independent with HEP    Status  New    Target Date  04/01/19      PT LONG TERM GOAL #2   Title  report pain < 5/10 after full day of work for improved function  Status  New    Target Date  04/01/19      PT LONG TERM GOAL #3   Title  demonstrate 4/5 hip strength for improved function    Status  New    Target Date  04/01/19      PT LONG TERM GOAL #4   Title  report 75% improvement in pain/mobility during the night for improved mobility    Status  New    Target Date  04/01/19            Plan - 03/02/19 1356    Clinical Impression Statement  Pt tolerated session well today and was very pleased with DN response on Rt side after last session.  Repeated on Lt side today with positive response.  Will conitnue to benefit from PT to address deficits listed.    Personal Factors and Comorbidities  Comorbidity 2    Comorbidities  malignant tumor of ilium, anxiety    Examination-Activity Limitations  Stand;Sit;Locomotion Level;Lift    Examination-Participation Restrictions  Other   occupation   Stability/Clinical Decision Making  Stable/Uncomplicated    Rehab Potential  Good    PT Frequency  2x / week    PT Duration  6 weeks    PT Treatment/Interventions  ADLs/Self Care Home Management;Cryotherapy;Electrical Stimulation;Ultrasound;Traction;Moist Heat;Stair training;Functional mobility  training;Therapeutic activities;Therapeutic exercise;Patient/family education;Neuromuscular re-education;Manual techniques;Taping;Dry needling    PT Next Visit Plan  possible DN to QL and glutes, manual/modalities PRN, progress core/hip strengthening    PT Home Exercise Plan  Access Code: [email or web address]    Consulted and Agree with Plan of Care  Patient       Patient will benefit from skilled therapeutic intervention in order to improve the following deficits and impairments:  Increased fascial restricitons, Increased muscle spasms, Pain, Postural dysfunction, Decreased mobility, Decreased strength, Impaired flexibility  Visit Diagnosis: Chronic midline low back pain without sciatica  Abnormal posture  Muscle weakness (generalized)     Problem List Patient Active Problem List   Diagnosis Date Noted  . Other intervertebral disc degeneration, lumbar region 12/09/2018  . Syncope 01/29/2018  . Diarrhea 09/01/2017  . Weight loss, non-intentional 09/01/2017  . Cervicalgia 04/18/2016  . Vertigo 08/19/2014  . Anxiety disorder 08/19/2014  . Hot flashes 08/19/2014  . Mass of right side of neck 06/17/2014  . right lower back mass 06/30/2012  . Neuroendocrine tumor 11/19/2011      Laureen Abrahams, PT, DPT 03/02/19 1:58 PM    [facility] [address] [place], [place], [address] Phone: [phone]   Fax:  [phone]  Name: Samantha Martinez MRN: [id number] Date of Birth: 1950/11/20

## 2019-03-04 DIAGNOSIS — Z86010 Personal history of colonic polyps: Secondary | ICD-10-CM | POA: Diagnosis not present

## 2019-03-04 DIAGNOSIS — D122 Benign neoplasm of ascending colon: Secondary | ICD-10-CM | POA: Diagnosis not present

## 2019-03-04 DIAGNOSIS — D124 Benign neoplasm of descending colon: Secondary | ICD-10-CM | POA: Diagnosis not present

## 2019-03-04 DIAGNOSIS — K635 Polyp of colon: Secondary | ICD-10-CM | POA: Diagnosis not present

## 2019-03-05 ENCOUNTER — Ambulatory Visit (INDEPENDENT_AMBULATORY_CARE_PROVIDER_SITE_OTHER): Payer: Medicare HMO | Admitting: Physical Therapy

## 2019-03-05 ENCOUNTER — Encounter: Payer: Self-pay | Admitting: Physical Therapy

## 2019-03-05 ENCOUNTER — Other Ambulatory Visit: Payer: Self-pay

## 2019-03-05 DIAGNOSIS — G8929 Other chronic pain: Secondary | ICD-10-CM | POA: Diagnosis not present

## 2019-03-05 DIAGNOSIS — M545 Low back pain, unspecified: Secondary | ICD-10-CM

## 2019-03-05 DIAGNOSIS — M6281 Muscle weakness (generalized): Secondary | ICD-10-CM

## 2019-03-05 DIAGNOSIS — M542 Cervicalgia: Secondary | ICD-10-CM

## 2019-03-05 DIAGNOSIS — R293 Abnormal posture: Secondary | ICD-10-CM | POA: Diagnosis not present

## 2019-03-05 NOTE — Therapy (Addendum)
[place], [place], [address] Phone: [phone]   Fax:  [phone]  Physical Therapy Treatment/Discharge Summary  Patient Details  Name: Samantha Martinez MRN: [id number] Date of Birth: 1950/05/10 Referring Provider (PT): Marybelle Killings, MD   Encounter Date: 03/05/2019  PT End of Session - 03/05/19 1141    Visit Number  5    Number of Visits  12    Date for PT Re-Evaluation  04/01/19    PT Start Time  1102    PT Stop Time  1140    PT Time Calculation (min)  38 min    Activity Tolerance  Patient tolerated treatment well    Behavior During Therapy  [facility] for tasks assessed/performed       Past Medical History:  Diagnosis Date  . Goiter   . Malignant carcinoid tumor of the ileum [facility])     Past Surgical History:  Procedure Laterality Date  . APPENDECTOMY    . COLPOSCOPY    . OVARIAN CYST REMOVAL    . THYROIDECTOMY      There were no vitals filed for this visit.  Subjective Assessment - 03/05/19 1106    Subjective  doing well, feels like she could graduate today. back is 75-80% improved.    Limitations  Sitting;Standing;Walking    How long can you sit comfortably?  15 min    How long can you stand comfortably?  tries not to stand in one place    Diagnostic tests  x rays: arthritis; L4-5 degenerative spondylolisthesis    Patient Stated Goals  improve pain    Currently in Pain?  No/denies    Pain Onset  More than a month ago         [facility] PT Assessment - 03/05/19 1133      Assessment   Medical Diagnosis  M51.36 (ICD-10-CM) - Other intervertebral disc degeneration, lumbar region    Referring Provider (PT)  Marybelle Killings, MD      Strength   Right Hip Flexion  4/5    Right Hip Extension  4+/5    Right Hip ABduction  5/5    Left Hip Flexion  4/5    Left Hip Extension  4+/5    Left Hip ABduction  4+/5                   [facility] Adult PT Treatment/Exercise - 03/05/19 1108      Lumbar  Exercises: Stretches   Passive Hamstring Stretch  Right;Left;30 seconds;2 reps    Single Knee to Chest Stretch  Right;Left;2 reps;30 seconds    Piriformis Stretch  Right;Left;2 reps;30 seconds    Other Lumbar Stretch Exercise  Rt lat stretch 2x30 sec      Lumbar Exercises: Aerobic   Nustep  L5 x 6 min                  PT Long Term Goals - 03/05/19 1141      PT LONG TERM GOAL #1   Title  independent with HEP    Status  Achieved      PT LONG TERM GOAL #2   Title  report pain < 5/10 after full day of work for improved function    Baseline  11/20: 2/10    Status  Achieved      PT LONG TERM GOAL #3   Title  demonstrate 4/5 hip strength for improved function    Status  Achieved  PT LONG TERM GOAL #4   Title  report 75% improvement in pain/mobility during the night for improved mobility    Status  Achieved            Plan - 03/05/19 1141    Clinical Impression Statement  Pt has met all goals and is doing very well at this time.  Will hold PT x 2 weeks, and if she doens't return will d/c.  If she returns plan to reassess and renew PT.    Personal Factors and Comorbidities  Comorbidity 2    Comorbidities  malignant tumor of ilium, anxiety    Examination-Activity Limitations  Stand;Sit;Locomotion Level;Lift    Examination-Participation Restrictions  Other   occupation   Stability/Clinical Decision Making  Stable/Uncomplicated    Rehab Potential  Good    PT Frequency  2x / week    PT Duration  6 weeks    PT Treatment/Interventions  ADLs/Self Care Home Management;Cryotherapy;Electrical Stimulation;Ultrasound;Traction;Moist Heat;Stair training;Functional mobility training;Therapeutic activities;Therapeutic exercise;Patient/family education;Neuromuscular re-education;Manual techniques;Taping;Dry needling    PT Next Visit Plan  hold x 2 wks, d/c v/s reassess    PT Home Exercise Plan  Access Code: [email or web address]    Consulted and Agree with Plan of Care  Patient        Patient will benefit from skilled therapeutic intervention in order to improve the following deficits and impairments:  Increased fascial restricitons, Increased muscle spasms, Pain, Postural dysfunction, Decreased mobility, Decreased strength, Impaired flexibility  Visit Diagnosis: Chronic midline low back pain without sciatica  Abnormal posture  Muscle weakness (generalized)  Cervicalgia     Problem List Patient Active Problem List   Diagnosis Date Noted  . Other intervertebral disc degeneration, lumbar region 12/09/2018  . Syncope 01/29/2018  . Diarrhea 09/01/2017  . Weight loss, non-intentional 09/01/2017  . Cervicalgia 04/18/2016  . Vertigo 08/19/2014  . Anxiety disorder 08/19/2014  . Hot flashes 08/19/2014  . Mass of right side of neck 06/17/2014  . right lower back mass 06/30/2012  . Neuroendocrine tumor 11/19/2011      Laureen Abrahams, PT, DPT 03/05/19 11:43 AM     [facility] [address] [place], [place], [address] Phone: [phone]   Fax:  [phone]  Name: Samantha Martinez MRN: [id number] Date of Birth: 1950/11/10      PHYSICAL THERAPY DISCHARGE SUMMARY  Visits from Start of Care: 5  Current functional level related to goals / functional outcomes: See above   Remaining deficits: See above   Education / Equipment: HEP, DN  Plan: Patient agrees to discharge.  Patient goals were met. Patient is being discharged due to meeting the stated rehab goals.  ?????    Laureen Abrahams, PT, DPT 03/19/19 8:57 AM   [facility] [address] [place], [place], [address] Phone: [phone]   Fax:  [phone]

## 2019-03-09 ENCOUNTER — Encounter: Payer: Medicare HMO | Admitting: Physical Therapy

## 2019-03-10 ENCOUNTER — Encounter: Payer: Medicare HMO | Admitting: Physical Therapy

## 2019-03-16 ENCOUNTER — Encounter: Payer: Medicare HMO | Admitting: Physical Therapy

## 2019-03-17 ENCOUNTER — Ambulatory Visit: Payer: Medicare HMO | Admitting: Orthopaedic Surgery

## 2019-03-18 ENCOUNTER — Encounter: Payer: Medicare HMO | Admitting: Physical Therapy

## 2019-03-20 DIAGNOSIS — L309 Dermatitis, unspecified: Secondary | ICD-10-CM | POA: Diagnosis not present

## 2019-03-20 DIAGNOSIS — T2220XA Burn of second degree of shoulder and upper limb, except wrist and hand, unspecified site, initial encounter: Secondary | ICD-10-CM | POA: Diagnosis not present

## 2019-03-29 DIAGNOSIS — K625 Hemorrhage of anus and rectum: Secondary | ICD-10-CM | POA: Diagnosis not present

## 2019-05-19 ENCOUNTER — Ambulatory Visit: Payer: Medicare HMO | Admitting: Orthopaedic Surgery

## 2019-05-28 ENCOUNTER — Other Ambulatory Visit: Payer: Self-pay

## 2019-05-28 ENCOUNTER — Ambulatory Visit: Payer: Medicare HMO | Admitting: Orthopaedic Surgery

## 2019-05-28 ENCOUNTER — Encounter: Payer: Self-pay | Admitting: Orthopaedic Surgery

## 2019-05-28 VITALS — BP 142/94 | HR 95 | Ht 63.0 in | Wt 180.0 lb

## 2019-05-28 DIAGNOSIS — M5136 Other intervertebral disc degeneration, lumbar region: Secondary | ICD-10-CM

## 2019-05-28 MED ORDER — CYCLOBENZAPRINE HCL 10 MG PO TABS: 10.0000 mg | ORAL_TABLET | Freq: Every day | ORAL | 0 refills | Status: DC

## 2019-05-28 NOTE — Progress Notes (Signed)
Office Visit Note   Patient: Samantha Martinez           Date of Birth: [birth year]           MRN: [id number] Visit Date: 05/28/2019              Requested by: Seward Carol, MD [address] [place],  [place] [address] PCP: Seward Carol, MD   Assessment & Plan: Visit Diagnoses:  1. Other intervertebral disc degeneration, lumbar region     Plan: Patient again mention that she does not want to consider any type of surgery at this point.  We will set up for an epidural with Dr. Ernestina Patches neither the L4-5 facet versus lateral recess injection at L4-5 which everything could be best based on her symptoms.  She can follow-up with me in 2 months.  Previous MRI scan was reviewed with patient again pathophysiology discussed plan x-rays were reviewed.  Follow-Up Instructions: Return in about 2 months (around 07/26/2019).   Orders:  Orders Placed This Encounter  Procedures  . Ambulatory referral to Physical Medicine Rehab   Meds ordered this encounter  Medications  . cyclobenzaprine (FLEXERIL) 10 MG tablet    Sig: Take 1 tablet (10 mg total) by mouth at bedtime.    Dispense:  30 tablet    Refill:  0      Procedures: No procedures performed   Clinical Data: No additional findings.   Subjective: Chief Complaint  Patient presents with  . Lower Back - Pain, Follow-up    HPI 69 year old female returns for 45-month follow-up for continued ongoing problems with lumbar pain.  She has been through therapy felt like she got slight relief from dry needling.  She states gradually over the last 2 years back pain is getting worse it radiates more from her back and her hips at times she states she has spasms in the lumbar spine which have been excruciating for the last 2 weeks seems to be worse at the end of the day and at night when she tries to go to sleep she has had to use a cane to ambulate at night.  She states sometimes when the pain is severe she is unable to hold her  bladder.  She denies constipation problems.  She had some problems with gas saw GI doctor who placed her on some medicine and does not think it really helped any.  She does have some degenerative facets by previous imaging and some degree of lateral recess narrowing on the left at L4-5.  Patient can walk a few blocks she states she does better leaning over a grocery cart.  More pain left paralumbar than right.  She can only sit for an hour or so and then has to get up and walk around.  Problems at night sleeping.  MRI scan showed some anterolisthesis at L4-5 with facet sclerosis and facet edema.  No significant central stenosis.  Review of Systems updated unchanged from 11/2618 office visit other than as mentioned in HPI.   Objective: Vital Signs: BP (!) 142/94   Pulse 95   Ht 5\' 3"  (1.6 m)   Wt 180 lb (81.6 kg)   BMI 31.89 kg/m   Physical Exam Constitutional:      Appearance: She is well-developed.  HENT:     Head: Normocephalic.     Right Ear: External ear normal.     Left Ear: External ear normal.  Eyes:     Pupils: Pupils  are equal, round, and reactive to light.  Neck:     Thyroid: No thyromegaly.     Trachea: No tracheal deviation.  Cardiovascular:     Rate and Rhythm: Normal rate.  Pulmonary:     Effort: Pulmonary effort is normal.  Abdominal:     Palpations: Abdomen is soft.  Skin:    General: Skin is warm and dry.  Neurological:     Mental Status: She is alert and oriented to person, place, and time.  Psychiatric:        Behavior: Behavior normal.     Ortho Exam patient has pain with palpation paralumbar region more on the left than right some tenderness over the trochanters negative logroll the hip some pain with straight leg raising at 90 degrees on the left.  Knee and ankle jerk are intact she can walk on her toes walk on her heels no atrophy.  Posterior tib and peroneals are strong.  Distal pulses are intact.  Specialty Comments:  No specialty comments available.   Imaging: No results found.   PMFS History: Patient Active Problem List   Diagnosis Date Noted  . Other intervertebral disc degeneration, lumbar region 12/09/2018  . Syncope 01/29/2018  . Diarrhea 09/01/2017  . Weight loss, non-intentional 09/01/2017  . Cervicalgia 04/18/2016  . Vertigo 08/19/2014  . Anxiety disorder 08/19/2014  . Hot flashes 08/19/2014  . Mass of right side of neck 06/17/2014  . right lower back mass 06/30/2012  . Neuroendocrine tumor 11/19/2011   Past Medical History:  Diagnosis Date  . Goiter   . Malignant carcinoid tumor of the ileum [facility])     Family History  Problem Relation Age of Onset  . Cancer Mother        colon cancer    Past Surgical History:  Procedure Laterality Date  . APPENDECTOMY    . COLPOSCOPY    . OVARIAN CYST REMOVAL    . THYROIDECTOMY     Social History   Occupational History  . Not on file  Tobacco Use  . Smoking status: Never Smoker  . Smokeless tobacco: Never Used  Substance and Sexual Activity  . Alcohol use: No  . Drug use: No  . Sexual activity: Not on file

## 2019-06-01 ENCOUNTER — Other Ambulatory Visit: Payer: Self-pay | Admitting: Physical Medicine and Rehabilitation

## 2019-06-01 ENCOUNTER — Telehealth: Payer: Self-pay | Admitting: Physical Medicine and Rehabilitation

## 2019-06-01 DIAGNOSIS — F411 Generalized anxiety disorder: Secondary | ICD-10-CM

## 2019-06-01 MED ORDER — DIAZEPAM 5 MG PO TABS: ORAL_TABLET | ORAL | 0 refills | Status: DC

## 2019-06-01 NOTE — Progress Notes (Signed)
Pre-procedure diazepam ordered for pre-operative anxiety.  

## 2019-06-01 NOTE — Telephone Encounter (Signed)
Done

## 2019-06-05 ENCOUNTER — Emergency Department (HOSPITAL_COMMUNITY)
Admission: EM | Admit: 2019-06-05 | Discharge: 2019-06-05 | Disposition: A | Discharge status: Home / Self Care | Payer: Medicare HMO | Attending: Emergency Medicine | Admitting: Emergency Medicine

## 2019-06-05 ENCOUNTER — Other Ambulatory Visit: Payer: Self-pay

## 2019-06-05 ENCOUNTER — Emergency Department (HOSPITAL_COMMUNITY): Payer: Medicare HMO

## 2019-06-05 DIAGNOSIS — G8929 Other chronic pain: Secondary | ICD-10-CM | POA: Insufficient documentation

## 2019-06-05 DIAGNOSIS — M545 Low back pain, unspecified: Secondary | ICD-10-CM

## 2019-06-05 DIAGNOSIS — Z85068 Personal history of other malignant neoplasm of small intestine: Secondary | ICD-10-CM | POA: Diagnosis not present

## 2019-06-05 DIAGNOSIS — Z79899 Other long term (current) drug therapy: Secondary | ICD-10-CM | POA: Insufficient documentation

## 2019-06-05 MED ORDER — HYDROCODONE-ACETAMINOPHEN 5-325 MG PO TABS
1.0000 | ORAL_TABLET | Freq: Once | ORAL | Status: AC
  Administered 2019-06-05: 10:00:00 1 via ORAL
  Filled 2019-06-05: qty 1

## 2019-06-05 MED ORDER — LIDOCAINE 5 % EX PTCH
2.0000 | MEDICATED_PATCH | CUTANEOUS | Status: DC
  Administered 2019-06-05: 2 via TRANSDERMAL
  Filled 2019-06-05: qty 2

## 2019-06-05 MED ORDER — NAPROXEN 500 MG PO TABS: 500.0000 mg | ORAL_TABLET | Freq: Two times a day (BID) | ORAL | 0 refills | Status: DC

## 2019-06-05 MED ORDER — CYCLOBENZAPRINE HCL 10 MG PO TABS: 10.0000 mg | ORAL_TABLET | Freq: Two times a day (BID) | ORAL | 0 refills | Status: DC | PRN

## 2019-06-05 NOTE — ED Provider Notes (Signed)
  Face-to-face evaluation   History: She presents for evaluation of a sensation of a gas bubble in her left lower back.  No known recent trauma.  History of similar pain with remote trauma.  No lower extremity symptoms.  Physical exam: Alert, calm cooperative.  She is somewhat uncomfortable but is able to move about on the stretcher without severe pain.  No dysarthria or aphasia.  She is nontoxic.  Medical screening examination/treatment/procedure(s) were conducted as a shared visit with non-physician practitioner(s) and myself.  I personally evaluated the patient during the encounter    Daleen Bo, MD 06/05/19 1540

## 2019-06-05 NOTE — ED Notes (Signed)
Pt verbalizes understanding of DC instructions. Pt belongings returned and is ambulatory out of ED.  

## 2019-06-05 NOTE — Discharge Instructions (Addendum)
As discussed, your MRI was negative for spinal cord compression. It did show degenerative disease. Call your orthopedic doctor on Monday to see if you can schedule an earlier appointment for your epidural injection. I am sending you home with naproxen for pain. You can take twice a day as needed for pain. Do not mix with other over the counter medications except tylenol. I am also sending you home with Flexeril. You can take twice a day as needed for pain.  Medication can cause drowsiness and do not drive or operate machinery while on the medication.  May also purchase over-the-counter Lidoderm patches and Voltaren gel for added pain relief.  Return to the ER for new or worsening symptoms.

## 2019-06-05 NOTE — ED Triage Notes (Signed)
Patient reports she has chronic back pain but last night she was going to bathroom and back began hurting so bad it made her use the bathroom on herself. Patient says she has a "bubble" in her back causing pain and that her "vertebras are messed up". Patient states once she passes gas she feels better.

## 2019-06-05 NOTE — ED Notes (Signed)
Pt transported to MRI 

## 2019-06-05 NOTE — ED Provider Notes (Signed)
[facility] DEPT Provider Note   CSN: [id number] Arrival date & time: 06/05/19  [id number]     History Chief Complaint  Patient presents with  . Back Pain    Samantha Martinez is a 69 y.o. female with a past medical history significant for malignant carcinoid tumor of the ileum status post resection, anxiety, and chronic low back pain who presents to the ED due to worsening low back pain that started this morning at 3:30 AM. Patient notes she has had low back pain for numerous years. Patient states she was having severe low back pain early this morning and attempted to get out of bed when she felt acutely lightheaded.  Patient laid on the floor and urinated and had a bowel movement on herself due to the extreme low back pain.  Patient denies loss of consciousness and head injury.  Patient admits to lower extremity weakness and numbness and tingling in her feet.  Patient is scheduled for a lumbar epidural injection on 06/15/2019.  Patient has not tried anything for her pain prior to arrival.  Patient also admits to frequent "bubbles" on her left low back that cause severe pain which she attributes to gas. Patient notes that after she passes gas the "bubble pain" is relieved.  Patient denies saddle paresthesias, IV drug use, fever, and chills.  She notes pain is relieved by lying on her side. Patient denies further bowel/bladder incontinence. Denies abdominal pain, nausea, vomiting, and diarrhea. Denies urinary symptoms.   Chart reviewed.  Patient sees Dr. Lorin Mercy with orthopedics for chronic low back pain.  Patient had an MRI on 11/26/2018 which demonstrated some anterolisthesis at L4-L5 with facet sclerosis and facet edema. No significant central stenosis.   Past Medical History:  Diagnosis Date  . Goiter   . Malignant carcinoid tumor of the ileum [facility])     Patient Active Problem List   Diagnosis Date Noted  . Other intervertebral disc degeneration, lumbar region  12/09/2018  . Syncope 01/29/2018  . Diarrhea 09/01/2017  . Weight loss, non-intentional 09/01/2017  . Cervicalgia 04/18/2016  . Vertigo 08/19/2014  . Anxiety disorder 08/19/2014  . Hot flashes 08/19/2014  . Mass of right side of neck 06/17/2014  . right lower back mass 06/30/2012  . Neuroendocrine tumor 11/19/2011    Past Surgical History:  Procedure Laterality Date  . APPENDECTOMY    . COLPOSCOPY    . OVARIAN CYST REMOVAL    . THYROIDECTOMY       OB History   No obstetric history on file.     Family History  Problem Relation Age of Onset  . Cancer Mother        colon cancer    Social History   Tobacco Use  . Smoking status: Never Smoker  . Smokeless tobacco: Never Used  Substance Use Topics  . Alcohol use: No  . Drug use: No    Home Medications Prior to Admission medications   Medication Sig Start Date End Date Taking? Authorizing Provider  ALPRAZolam Duanne Moron) 0.5 MG tablet Take 0.5 mg by mouth at bedtime as needed for anxiety.   Yes [provider]  DULoxetine (CYMBALTA) 30 MG capsule Take 30 mg by mouth daily. 03/18/19  Yes [provider]  Multiple Vitamins-Minerals (MULTIVITAMIN PO) Take 1 tablet by mouth daily.   Yes [provider]  Phenyleph-CPM-DM-APAP (ALKA-SELTZER PLUS COLD & COUGH) 08-14-08-325 MG CAPS Take by mouth. As directed, per package instructions.   Yes [provider]  cyclobenzaprine (FLEXERIL) 10 MG tablet Take 1 tablet (10 mg total) by mouth at bedtime. Patient not taking: Reported on 06/05/2019 05/28/19   Marybelle Killings, MD  cyclobenzaprine (FLEXERIL) 10 MG tablet Take 1 tablet (10 mg total) by mouth 2 (two) times daily as needed for muscle spasms. 06/05/19   Suzy Bouchard, PA-C  diazepam (VALIUM) 5 MG tablet Take 1 by mouth 1 hour  pre-procedure with very light food. May bring 2nd tablet to appointment. Patient not taking: Reported on 06/05/2019 06/01/19   Magnus Sinning, MD  naproxen (NAPROSYN) 500 MG  tablet Take 1 tablet (500 mg total) by mouth 2 (two) times daily. 06/05/19   Suzy Bouchard, PA-C    Allergies    Latex  Review of Systems   Review of Systems  Constitutional: Negative for chills and fever.  Respiratory: Negative for shortness of breath.   Cardiovascular: Negative for chest pain.  Gastrointestinal: Negative for abdominal pain, diarrhea, nausea and vomiting.  Genitourinary: Positive for difficulty urinating (urinated onself this AM).  Musculoskeletal: Positive for back pain and gait problem. Negative for neck pain.  Neurological: Positive for weakness (lower extremities), light-headedness and numbness (bilateral feet).  All other systems reviewed and are negative.   Physical Exam Updated Vital Signs BP 135/88   Pulse 79   Temp 98.8 F (37.1 C) (Oral)   Resp 18   Ht 5\' 3"  (1.6 m)   Wt 81.6 kg   SpO2 98%   BMI 31.89 kg/m   Physical Exam Vitals and nursing note reviewed.  Constitutional:      General: She is not in acute distress.    Appearance: She is not ill-appearing.  HENT:     Head: Normocephalic.  Eyes:     Pupils: Pupils are equal, round, and reactive to light.  Cardiovascular:     Rate and Rhythm: Normal rate and regular rhythm.     Pulses: Normal pulses.     Heart sounds: Normal heart sounds. No murmur. No friction rub. No gallop.   Pulmonary:     Effort: Pulmonary effort is normal.     Breath sounds: Normal breath sounds.  Abdominal:     General: Abdomen is flat. Bowel sounds are normal. There is no distension.     Palpations: Abdomen is soft.     Tenderness: There is no abdominal tenderness. There is no right CVA tenderness, left CVA tenderness, guarding or rebound.  Musculoskeletal:     Cervical back: Neck supple.     Comments: No T-spine midline tenderness, mild lumbar midline tenderness, no stepoff or deformity, reproducible left sided paraspinal tenderness No leg edema bilaterally Patient moves all extremities without  difficulty. DP/PT pulses 2+ and equal bilaterally Sensation grossly intact bilaterally Strength of knee flexion and extension is 5/5 Plantar and dorsiflexion of ankle 5/5 Able to ambulate without difficulty   Skin:    General: Skin is warm and dry.  Neurological:     General: No focal deficit present.  Psychiatric:        Mood and Affect: Mood normal.        Behavior: Behavior normal.     ED Results / Procedures / Treatments   Labs (all labs ordered are listed, but only abnormal results are displayed) Labs Reviewed - No data to display  EKG None  Radiology MR LUMBAR SPINE WO CONTRAST  Result Date: 06/05/2019 CLINICAL DATA:  Low back pain with progressive neurologic deficit EXAM: MRI LUMBAR SPINE WITHOUT CONTRAST TECHNIQUE: Multiplanar, multisequence  MR imaging of the lumbar spine was performed. No intravenous contrast was administered. COMPARISON:  07/03/2012 FINDINGS: Segmentation:  Standard lumbar numbering Alignment:  Grade 1 anterolisthesis at L4-5 Vertebrae: No fracture, evidence of discitis, or bone lesion. Mild discogenic edema at T12-L1 and L1-2. Conus medullaris and cauda equina: Conus extends to the L1 level. Conus and cauda equina appear normal. Paraspinal and other soft tissues: Negative Disc levels: T12- L1: Mild disc bulging and narrowing.  No impingement L1-L2: Mild disc narrowing and bulging.  No impingement L2-L3: Mild disc narrowing and bulging.  No impingement L3-L4: Mild disc narrowing and bulging. Mild facet spurring. No impingement L4-L5: Degenerative facet spurring with anterolisthesis. Advanced disc narrowing with endplate degeneration, ridging, and bulging. Mild right foraminal narrowing. Mild spinal stenosis with asymmetric right L5 impingement. L5-S1:Mild facet spurring. IMPRESSION: 1. No acute finding. 2. Multilevel degenerative disease with L4-5 anterolisthesis and right subarticular recess narrowing. Electronically Signed   By: Monte Fantasia M.D.   On:  06/05/2019 12:47    Procedures Procedures (including critical care time)  Medications Ordered in ED Medications  lidocaine (LIDODERM) 5 % 2 patch (2 patches Transdermal Patch Applied 06/05/19 1335)  HYDROcodone-acetaminophen (NORCO/VICODIN) 5-325 MG per tablet 1 tablet (1 tablet Oral Given 06/05/19 0955)    ED Course  I have reviewed the triage vital signs and the nursing notes.  Pertinent labs & imaging results that were available during my care of the patient were reviewed by me and considered in my medical decision making (see chart for details).    MDM Rules/Calculators/A&P                     69 year old female presents to the ED due to acute on chronic low back pain that started this morning around 3:30 AM.  Patient had one episode of urinary and bowel incontinence this morning.  Patient has a history of malignant carcinoid tumor of the ileum.  Denies IV drug use.  Denies fever and chills.  Patient afebrile, mildly tachycardic at 102 with otherwise unremarkable vitals.  Patient in no acute distress and non-ill-appearing.  Mild lumbar midline tenderness with reproducible left paraspinal tenderness.  Lower extremities neurovascularly intact.  Patient able to ambulate in the ED without difficulty.  Given patients episode of bowel/bladder incontinence will obtain MRI to rule out cauda equina; however, low suspicion given no neurological deficits on exam.  MRI personally reviewed which demonstrates: 1. No acute finding.  2. Multilevel degenerative disease with L4-5 anterolisthesis and  right subarticular recess narrowing.   No concern for cauda equina or central cord compression. Suspect pain related to chronic low back pain. Discussed case with Dr. Eulis Foster who evaluated patient at bedside and agrees with assessment and plan. Will discharge patient with symptomatic treatment with naproxen and Robaxin. Advised patient to get OTC voltaren gel and lidoderm patches for added pain relief.  Instructed patient to call orthopedic doctor on Monday to see if her epidural injection could get moved up. Strict ED precautions discussed with patient. Patient states understanding and agrees to plan. Patient discharged home in no acute distress and stable vitals  Final Clinical Impression(s) / ED Diagnoses Final diagnoses:  Chronic bilateral low back pain without sciatica    Rx / DC Orders ED Discharge Orders         Ordered    naproxen (NAPROSYN) 500 MG tablet  2 times daily     06/05/19 1314    cyclobenzaprine (FLEXERIL) 10 MG tablet  2 times  daily PRN     06/05/19 1314           Karie Kirks 06/05/19 1340    Daleen Bo, MD 06/05/19 1540

## 2019-06-15 ENCOUNTER — Other Ambulatory Visit: Payer: Self-pay

## 2019-06-15 ENCOUNTER — Ambulatory Visit: Payer: Self-pay

## 2019-06-15 ENCOUNTER — Ambulatory Visit: Payer: Medicare HMO | Admitting: Physical Medicine and Rehabilitation

## 2019-06-15 ENCOUNTER — Encounter: Payer: Self-pay | Admitting: Physical Medicine and Rehabilitation

## 2019-06-15 VITALS — BP 148/99 | HR 102

## 2019-06-15 DIAGNOSIS — M47816 Spondylosis without myelopathy or radiculopathy, lumbar region: Secondary | ICD-10-CM

## 2019-06-15 MED ORDER — METHYLPREDNISOLONE ACETATE 80 MG/ML IJ SUSP: 40.0000 mg | Freq: Once | INTRAMUSCULAR | Status: DC

## 2019-06-15 NOTE — Progress Notes (Signed)
   Numeric Pain Rating Scale and Functional Assessment Average Pain (8)   In the last MONTH (on 0-10 scale) has pain interfered with the following?  1. General activity like being  able to carry out your everyday physical activities such as walking, climbing stairs, carrying groceries, or moving a chair?  Rating(9)   +Driver, -BT, -Dye Allergies.  

## 2019-07-15 NOTE — Procedures (Signed)
Lumbar Facet Joint Intra-Articular Injection(s) with Fluoroscopic Guidance  Patient: Samantha Martinez      Date of Birth: Dec 20, 1950 MRN: [id number] PCP: Seward Carol, MD      Visit Date: 06/15/2019   Universal Protocol:    Date/Time: 06/15/2019  Consent Given By: the patient  Position: PRONE   Additional Comments: Vital signs were monitored before and after the procedure. Patient was prepped and draped in the usual sterile fashion. The correct patient, procedure, and site was verified.   Injection Procedure Details:  Procedure Site One Meds Administered:  Meds ordered this encounter  Medications  . methylPREDNISolone acetate (DEPO-MEDROL) injection 40 mg     Laterality: Left  Location/Site:  L4-L5  Needle size: 22 guage  Needle type: Spinal  Needle Placement: Articular  Findings:  -Comments: Excellent flow of contrast producing a partial arthrogram.  Procedure Details: The fluoroscope beam is vertically oriented in AP, and the inferior recess is visualized beneath the lower pole of the inferior apophyseal process, which represents the target point for needle insertion. When direct visualization is difficult the target point is located at the medial projection of the vertebral pedicle. The region overlying each aforementioned target is locally anesthetized with a 1 to 2 ml. volume of 1% Lidocaine without Epinephrine.   The spinal needle was inserted into each of the above mentioned facet joints using biplanar fluoroscopic guidance. A 0.25 to 0.5 ml. volume of Isovue-250 was injected and a partial facet joint arthrogram was obtained. A single spot film was obtained of the resulting arthrogram.    One to 1.25 ml of the steroid/anesthetic solution was then injected into each of the facet joints noted above.   Additional Comments:  The patient tolerated the procedure well Dressing: 2 x 2 sterile gauze and Band-Aid    Post-procedure details: Patient was  observed during the procedure. Post-procedure instructions were reviewed.  Patient left the clinic in stable condition.

## 2019-07-15 NOTE — Progress Notes (Signed)
Samantha Martinez - 69 y.o. female MRN [id number]  Date of birth: July 07, 1950  Office Visit Note: Visit Date: 06/15/2019 PCP: Seward Carol, MD Referred by: Seward Carol, MD  Subjective: Chief Complaint  Patient presents with  . Lower Back - Pain   HPI:  Samantha Martinez is a 69 y.o. female who comes in today For planned diagnostic left L4-5 facet joint block as requested by Dr. Rodell Perna.  ROS Otherwise per HPI.  Assessment & Plan: Visit Diagnoses:  1. Spondylosis without myelopathy or radiculopathy, lumbar region     Plan: No additional findings.   Meds & Orders:  Meds ordered this encounter  Medications  . methylPREDNISolone acetate (DEPO-MEDROL) injection 40 mg    Orders Placed This Encounter  Procedures  . Facet Injection  . XR C-ARM NO REPORT    Follow-up: No follow-ups on file.   Procedures: No procedures performed  Lumbar Facet Joint Intra-Articular Injection(s) with Fluoroscopic Guidance  Patient: Samantha Martinez Martinez      Date of Birth: September 21, 1950 MRN: [id number] PCP: Seward Carol, MD      Visit Date: 06/15/2019   Universal Protocol:    Date/Time: 06/15/2019  Consent Given By: the patient  Position: PRONE   Additional Comments: Vital signs were monitored before and after the procedure. Patient was prepped and draped in the usual sterile fashion. The correct patient, procedure, and site was verified.   Injection Procedure Details:  Procedure Site One Meds Administered:  Meds ordered this encounter  Medications  . methylPREDNISolone acetate (DEPO-MEDROL) injection 40 mg     Laterality: Left  Location/Site:  L4-L5  Needle size: 22 guage  Needle type: Spinal  Needle Placement: Articular  Findings:  -Comments: Excellent flow of contrast producing a partial arthrogram.  Procedure Details: The fluoroscope beam is vertically oriented in AP, and the inferior recess is visualized beneath the lower pole of the  inferior apophyseal process, which represents the target point for needle insertion. When direct visualization is difficult the target point is located at the medial projection of the vertebral pedicle. The region overlying each aforementioned target is locally anesthetized with a 1 to 2 ml. volume of 1% Lidocaine without Epinephrine.   The spinal needle was inserted into each of the above mentioned facet joints using biplanar fluoroscopic guidance. A 0.25 to 0.5 ml. volume of Isovue-250 was injected and a partial facet joint arthrogram was obtained. A single spot film was obtained of the resulting arthrogram.    One to 1.25 ml of the steroid/anesthetic solution was then injected into each of the facet joints noted above.   Additional Comments:  The patient tolerated the procedure well Dressing: 2 x 2 sterile gauze and Band-Aid    Post-procedure details: Patient was observed during the procedure. Post-procedure instructions were reviewed.  Patient left the clinic in stable condition.     Clinical History: MRI LUMBAR SPINE WITHOUT CONTRAST  TECHNIQUE: Multiplanar, multisequence MR imaging of the lumbar spine was performed. No intravenous contrast was administered.  COMPARISON:  07/03/2012  FINDINGS: Segmentation:  Standard lumbar numbering  Alignment:  Grade 1 anterolisthesis at L4-5  Vertebrae: No fracture, evidence of discitis, or bone lesion. Mild discogenic edema at T12-L1 and L1-2.  Conus medullaris and cauda equina: Conus extends to the L1 level. Conus and cauda equina appear normal.  Paraspinal and other soft tissues: Negative  Disc levels:  T12- L1: Mild disc bulging and narrowing.  No impingement  L1-L2: Mild disc narrowing and bulging.  No  impingement  L2-L3: Mild disc narrowing and bulging.  No impingement  L3-L4: Mild disc narrowing and bulging. Mild facet spurring. No impingement  L4-L5: Degenerative facet spurring with anterolisthesis.  Advanced disc narrowing with endplate degeneration, ridging, and bulging. Mild right foraminal narrowing. Mild spinal stenosis with asymmetric right L5 impingement.  L5-S1:Mild facet spurring.  IMPRESSION: 1. No acute finding. 2. Multilevel degenerative disease with L4-5 anterolisthesis and right subarticular recess narrowing.   Electronically Signed   By: Monte Fantasia M.D.   On: 06/05/2019 12:47     Objective:  VS:  HT:    WT:   BMI:     BP:(!) 148/99  HR:(!) 102bpm  TEMP: ( )  RESP:  Physical Exam  Ortho Exam Imaging: No results found.

## 2019-08-17 ENCOUNTER — Encounter: Payer: Self-pay | Admitting: Orthopaedic Surgery

## 2019-08-17 ENCOUNTER — Other Ambulatory Visit: Payer: Self-pay | Admitting: Obstetrics and Gynecology

## 2019-08-17 ENCOUNTER — Ambulatory Visit: Payer: Medicare HMO | Admitting: Orthopaedic Surgery

## 2019-08-17 ENCOUNTER — Other Ambulatory Visit: Payer: Self-pay

## 2019-08-17 VITALS — BP 139/85 | HR 91 | Ht 64.0 in | Wt 175.0 lb

## 2019-08-17 DIAGNOSIS — G8929 Other chronic pain: Secondary | ICD-10-CM | POA: Diagnosis not present

## 2019-08-17 DIAGNOSIS — M545 Low back pain: Secondary | ICD-10-CM

## 2019-08-17 DIAGNOSIS — Z1231 Encounter for screening mammogram for malignant neoplasm of breast: Secondary | ICD-10-CM

## 2019-08-18 NOTE — Progress Notes (Signed)
Office Visit Note   Patient: Samantha Martinez           Date of Birth: 10/26/1950           MRN: [id number] Visit Date: 08/17/2019              Requested by: Seward Carol, MD [address] [place],  [place] [address] PCP: Seward Carol, MD   Assessment & Plan: Visit Diagnoses:  1. Chronic bilateral low back pain, unspecified whether sciatica present     Plan: Patient's lumbar MRI did not show any areas of significant compression that correspond with her symptoms.  Past history of carcinoid 2009.  Her relief of symptoms with bowel elimination and  bladder contracture is a concern.  We will obtain a CT abdomen and pelvis office follow-up after scan for review.  Follow-Up Instructions: After CT abdomen and pelvis.  Orders:  Orders Placed This Encounter  Procedures  . CT ABDOMEN PELVIS W CONTRAST   No orders of the defined types were placed in this encounter.     Procedures: No procedures performed   Clinical Data: No additional findings.   Subjective: Chief Complaint  Patient presents with  . Lower Back - Pain    HPI 69 year old female returns post lumbar facet injection 06/15/2019 by Dr. Ernestina Patches.  Patient states pain only was better for 1 week.  She still has back pain radiates into her buttocks occasionally into her right leg.  She states she has increased tension and discomfort and states when she goes to the bathroom and has a bowel movement or urinates she notes significant improvement in her pain.  Past history of carcinoid 2009 and colonoscopy within the last year was normal.  Patient states when she has increased gas pain she has significant increased pain that radiates into her buttocks and into her leg.  Previous MRI showed multilevel degenerative changes with some anterolisthesis at L4-5 and right subarticular recess narrowing opposite side from her left buttocks pain.  Review of Systems 14 point update unchanged from 12/09/2018 other than as  mentioned in HPI.   Objective: Vital Signs: BP 139/85   Pulse 91   Ht 5\' 4"  (1.626 m)   Wt 175 lb (79.4 kg)   BMI 30.04 kg/m   Physical Exam Constitutional:      Appearance: She is well-developed.  HENT:     Head: Normocephalic.     Right Ear: External ear normal.     Left Ear: External ear normal.  Eyes:     Pupils: Pupils are equal, round, and reactive to light.  Neck:     Thyroid: No thyromegaly.     Trachea: No tracheal deviation.  Cardiovascular:     Rate and Rhythm: Normal rate.  Pulmonary:     Effort: Pulmonary effort is normal.  Abdominal:     Palpations: Abdomen is soft.  Skin:    General: Skin is warm and dry.  Neurological:     Mental Status: She is alert and oriented to person, place, and time.  Psychiatric:        Behavior: Behavior normal.     Ortho Exam patient has sciatic notch tenderness on the left no abdominal tenderness negative logroll the hips knee and ankle jerk are intact.  Anterior tib gastrocsoleus is intact.  Quads are strong no hip flexion weakness.  Specialty Comments:  No specialty comments available.  Imaging: No results found.   PMFS History: Patient Active Problem List  Diagnosis Date Noted  . Other intervertebral disc degeneration, lumbar region 12/09/2018  . Syncope 01/29/2018  . Diarrhea 09/01/2017  . Weight loss, non-intentional 09/01/2017  . Cervicalgia 04/18/2016  . Vertigo 08/19/2014  . Anxiety disorder 08/19/2014  . Hot flashes 08/19/2014  . Mass of right side of neck 06/17/2014  . right lower back mass 06/30/2012  . Neuroendocrine tumor 11/19/2011   Past Medical History:  Diagnosis Date  . Goiter   . Malignant carcinoid tumor of the ileum [facility])     Family History  Problem Relation Age of Onset  . Cancer Mother        colon cancer    Past Surgical History:  Procedure Laterality Date  . APPENDECTOMY    . COLPOSCOPY    . OVARIAN CYST REMOVAL    . THYROIDECTOMY     Social History   Occupational  History  . Not on file  Tobacco Use  . Smoking status: Never Smoker  . Smokeless tobacco: Never Used  Substance and Sexual Activity  . Alcohol use: No  . Drug use: No  . Sexual activity: Not on file

## 2019-08-25 ENCOUNTER — Ambulatory Visit
Admission: RE | Admit: 2019-08-25 | Discharge: 2019-08-25 | Disposition: A | Discharge status: Home / Self Care | Payer: Medicare HMO | Source: Ambulatory Visit | Attending: Obstetrics and Gynecology | Admitting: Obstetrics and Gynecology

## 2019-08-25 ENCOUNTER — Other Ambulatory Visit: Payer: Self-pay

## 2019-08-25 DIAGNOSIS — Z1231 Encounter for screening mammogram for malignant neoplasm of breast: Secondary | ICD-10-CM

## 2019-09-01 ENCOUNTER — Ambulatory Visit
Admission: RE | Admit: 2019-09-01 | Discharge: 2019-09-01 | Disposition: A | Discharge status: Home / Self Care | Payer: Medicare HMO | Source: Ambulatory Visit | Attending: Orthopaedic Surgery | Admitting: Orthopaedic Surgery

## 2019-09-01 DIAGNOSIS — G8929 Other chronic pain: Secondary | ICD-10-CM

## 2019-09-01 MED ORDER — IOPAMIDOL (ISOVUE-300) INJECTION 61%
100.0000 mL | Freq: Once | INTRAVENOUS | Status: AC | PRN
  Administered 2019-09-01: 100 mL via INTRAVENOUS

## 2019-11-22 ENCOUNTER — Telehealth: Payer: Self-pay | Admitting: Orthopaedic Surgery

## 2019-11-22 NOTE — Telephone Encounter (Signed)
Please advise 

## 2019-11-22 NOTE — Telephone Encounter (Signed)
Patient called advised she is still having a very bad pain on her left side when she lays down. Patient also need results of her CT scan done back in May. The number to contact patient is [phone]

## 2019-11-22 NOTE — Telephone Encounter (Signed)
I called discussed. She will make ROV and get lateral flexion / extension lumbar xrays on ROV

## 2019-11-22 NOTE — Telephone Encounter (Signed)
noted 

## 2019-12-14 ENCOUNTER — Ambulatory Visit: Payer: Medicare HMO | Admitting: Orthopaedic Surgery

## 2019-12-22 ENCOUNTER — Ambulatory Visit: Payer: Medicare HMO | Admitting: Orthopaedic Surgery

## 2019-12-22 ENCOUNTER — Ambulatory Visit (INDEPENDENT_AMBULATORY_CARE_PROVIDER_SITE_OTHER): Payer: Medicare HMO

## 2019-12-22 DIAGNOSIS — M5136 Other intervertebral disc degeneration, lumbar region: Secondary | ICD-10-CM | POA: Diagnosis not present

## 2019-12-22 DIAGNOSIS — G8929 Other chronic pain: Secondary | ICD-10-CM

## 2019-12-22 DIAGNOSIS — M545 Low back pain: Secondary | ICD-10-CM | POA: Diagnosis not present

## 2019-12-22 DIAGNOSIS — M51369 Other intervertebral disc degeneration, lumbar region without mention of lumbar back pain or lower extremity pain: Secondary | ICD-10-CM

## 2019-12-22 MED ORDER — METHYLPREDNISOLONE 4 MG PO TABS: ORAL_TABLET | ORAL | 0 refills | Status: DC

## 2019-12-22 MED ORDER — TIZANIDINE HCL 4 MG PO TABS: 4.0000 mg | ORAL_TABLET | Freq: Three times a day (TID) | ORAL | 0 refills | Status: DC | PRN

## 2019-12-22 NOTE — Progress Notes (Signed)
The patient is someone has come to see me for her lumbar spine for another opinion.  She is actually seen my partner Dr. Lorin Mercy and appropriately was referred to Dr. Ernestina Patches for a facet joint injection to the left sided L4-L5 which did not really help.  Since then she is also been through extensive physical therapy for her back.  She says her back pain is getting worse and is still seems to be across her lumbar spine more to the left side the right but is definitely affecting her being able to get out of bed and participate in activities day living.  She is a 69 year old female and is now diabetic and has no significant medical problems.  She has no radicular symptoms going down her legs.  There is a CT scan of her abdomen pelvis on the system.  There is also an MRI from February of this year and we did get plain films today of her lumbar spine.  She denies any change in bowel bladder function.  She is not a smoker.  She currently denies any headache, chest pain, shortness of breath, fever, chills, nausea, vomiting  She is alert and oriented x3 and in no acute distress  On examination of her lower extremities, she has a negative straight leg raise bilaterally.  She has excellent strength in the bilateral lower extremities and normal sensation and reflexes.  Examination of the lumbar spine shows pain with flexion extension of the lower lumbar spine and pain to palpation of the midline and to the left side at the lower lumbar spine.  The MRI and plain films were reviewed with her.  She does have significant degenerative changes at L4-L5 with spondylolisthesis as well.  There is arthritic changes of the facet joints as well.  I went over a spine model explained in detail what going on with her back.  I do feel it is worth getting at least an opinion from Dr. Kary Kos with neurosurgery to see if he has any thoughts about other treatment modalities for her.  The 1 injection she had in her back which I believe  is at the left L4-L5 facet did not help.  Physical therapy including dry needling has not helped.  I would like to at least try a 6-day steroid taper and some Zanaflex.  I offered gabapentin to take at night but she got tearful about that because her husband died from a combination of gabapentin and other medications.  She would rather stay off the gabapentin.  I do respect that.  All questions and concerns were answered and addressed.  We we will work on making that referral for her to Dr. Saintclair Halsted and I will see her back myself in about 4 weeks.

## 2019-12-23 ENCOUNTER — Other Ambulatory Visit: Payer: Self-pay

## 2019-12-23 DIAGNOSIS — M545 Low back pain, unspecified: Secondary | ICD-10-CM

## 2019-12-23 DIAGNOSIS — M4807 Spinal stenosis, lumbosacral region: Secondary | ICD-10-CM

## 2019-12-23 DIAGNOSIS — M5136 Other intervertebral disc degeneration, lumbar region: Secondary | ICD-10-CM

## 2019-12-23 DIAGNOSIS — G8929 Other chronic pain: Secondary | ICD-10-CM

## 2020-01-19 ENCOUNTER — Other Ambulatory Visit: Payer: Self-pay

## 2020-01-19 ENCOUNTER — Ambulatory Visit: Payer: Medicare HMO | Admitting: Orthopaedic Surgery

## 2020-01-19 ENCOUNTER — Encounter: Payer: Self-pay | Admitting: Orthopaedic Surgery

## 2020-01-19 DIAGNOSIS — G8929 Other chronic pain: Secondary | ICD-10-CM

## 2020-01-19 DIAGNOSIS — M545 Low back pain, unspecified: Secondary | ICD-10-CM

## 2020-01-19 DIAGNOSIS — M4807 Spinal stenosis, lumbosacral region: Secondary | ICD-10-CM

## 2020-01-19 NOTE — Progress Notes (Signed)
Patient comes in today after being referred to neurosurgery to see if they have any recommendations as a relates to facet joint mediated pain and some stenosis at the L4-L5 level.  She is a regular patient of Dr. Lorin Mercy in her own office for her back and is also seen Dr. Ernestina Patches for intervention.  She states that neurosurgery recommended starting physical therapy again.  They also provided a steroid taper and muscle relaxants.  I talked to her in length about alternating Aleve and Tylenol arthritis.  She reports the same pain that she has had.  There is no significant radicular components of this pain at all.  I can move her hips easily as well as her knees and feet and ankle.  She does have positive leg raise bilaterally and pain with mobility of lumbar spine.  We will work on setting up outpatient physical therapy at [facility] on [facility] for any modalities that they can try to help decrease her low back pain.

## 2020-02-04 ENCOUNTER — Encounter: Payer: Self-pay | Admitting: Physical Therapy

## 2020-02-04 ENCOUNTER — Ambulatory Visit: Payer: Medicare HMO | Attending: Orthopaedic Surgery | Admitting: Physical Therapy

## 2020-02-04 ENCOUNTER — Other Ambulatory Visit: Payer: Self-pay

## 2020-02-04 DIAGNOSIS — M542 Cervicalgia: Secondary | ICD-10-CM | POA: Insufficient documentation

## 2020-02-04 DIAGNOSIS — M545 Low back pain, unspecified: Secondary | ICD-10-CM

## 2020-02-04 DIAGNOSIS — M6281 Muscle weakness (generalized): Secondary | ICD-10-CM | POA: Diagnosis present

## 2020-02-04 DIAGNOSIS — R293 Abnormal posture: Secondary | ICD-10-CM | POA: Diagnosis present

## 2020-02-04 DIAGNOSIS — G8929 Other chronic pain: Secondary | ICD-10-CM | POA: Insufficient documentation

## 2020-02-04 NOTE — Therapy (Signed)
[place], [place], [address] Phone: [phone]   Fax:  [phone]  Physical Therapy Evaluation   Patient Details  Name: Samantha Martinez Samantha Martinez MRN: [id number] Date of Birth: December 14, 1950 Referring Provider (PT): Dr Jean Rosenthal    Encounter Date: 02/04/2020   PT End of Session - 02/04/20 0955    Visit Number 1    Number of Visits 6    Date for PT Re-Evaluation 03/17/20    Authorization Type Medicare [employer] No [place]    PT Start Time [phone]   Patient 6 minutes late   PT Stop Time 1015    PT Time Calculation (min) 39 min    Activity Tolerance Patient tolerated treatment well    Behavior During Therapy [facility] for tasks assessed/performed           Past Medical History:  Diagnosis Date   Goiter    Malignant carcinoid tumor of the ileum [facility])     Past Surgical History:  Procedure Laterality Date   APPENDECTOMY     COLPOSCOPY     OVARIAN CYST REMOVAL     THYROIDECTOMY      There were no vitals filed for this visit.   Subjective Assessment - 02/04/20 0945    Subjective Patient has long histroy of back pain at night for about a year and a half. She hads had injections which didn't help. She recnetly sat in a maasage chair which seems to have helped significanlty. She has most of her pain at night. She likes to lie on her left side which has caused pain. Prior to the massage chair she was unable to lie on her left side.    How long can you sit comfortably? works from home. Sits in a chair all day.    How long can you stand comfortably? Can feel pain in the lower back standing    How long can you walk comfortably? can ussualy walk unless she walks for a long period of time    Diagnostic tests MRI: Severe narrowing at L4-L5    Patient Stated Goals to have less pain in her back    Currently in Pain? Yes    Pain Score 5     Pain Location Back    Pain Orientation Left    Pain Descriptors /  Indicators Aching    Pain Type Chronic pain    Pain Radiating Towards can radiate across the  back and down into the hips at times    Pain Onset More than a month ago    Pain Frequency Constant    Aggravating Factors  lie in bed at night    Pain Relieving Factors rest    Effect of Pain on Daily Activities difficulty sleeping              [facility] PT Assessment - 02/04/20 0001      Assessment   Medical Diagnosis Low Back Pain     Referring Provider (PT) Dr Jean Rosenthal     Onset Date/Surgical Date --   1.5 years ago with sever pain    Hand Dominance Right    Next MD Visit a few months     Prior Therapy Has had physical therapy before. It didn't help.       Precautions   Precautions None      Restrictions   Weight Bearing Restrictions No      Balance Screen   Has the patient fallen  in the past 6 months No    Has the patient had a decrease in activity level because of a fear of falling?  No    Is the patient reluctant to leave their home because of a fear of falling?  No      Home Environment   Living Environment Private residence    Additional Comments 6 steps into the house. Has to hold onto something to get in       Prior Function   Level of Independence Independent    Vocation Full time employment    Vocation Requirements works form home. Is on a computer for most of the day     Leisure likes to walk;        Cognition   Overall Cognitive Status Within Functional Limits for tasks assessed    Attention Focused    Focused Attention Appears intact    Memory Appears intact    Awareness Appears intact    Problem Solving Appears intact      Observation/Other Assessments   Focus on Therapeutic Outcomes (FOTO)  48% linmitation 41% expected       Sensation   Light Touch Appears Intact      Coordination   Gross Motor Movements are Fluid and Coordinated Yes    Fine Motor Movements are Fluid and Coordinated Yes      ROM / Strength   AROM / PROM / Strength  AROM;PROM;Strength      AROM   AROM Assessment Site Lumbar    Lumbar Flexion 50 degrees with pain     Lumbar Extension painful     Lumbar - Right Rotation painful on the left     Lumbar - Left Rotation painful on the right       PROM   Overall PROM Comments pain with right hip flexion past 90 normal end feel; pain with IR    PROM Assessment Site Hip      Strength   Strength Assessment Site Hip;Knee    Right/Left Hip Right;Left    Right Hip Flexion 4/5    Right Hip ABduction 4+/5    Right Hip ADduction 4+/5    Left Hip Flexion 5/5    Left Hip ABduction 4+/5    Left Hip ADduction 4+/5    Right/Left Knee Right;Left    Right Knee Flexion 5/5    Right Knee Extension 5/5      Palpation   Palpation comment spasming in boilateral paraspinals and bilateral gluteals       Ambulation/Gait   Gait Comments left toe out greater then right; lateral movement with ambualtion                          [facility] Adult PT Treatment/Exercise - 02/04/20 0001      Lumbar Exercises: Stretches   Active Hamstring Stretch Limitations steaed hamstring strethc 3x20 sec hold     Piriformis Stretch Limitations setaed glute stretch 3x20 sec hold     Other Lumbar Stretch Exercise tennis ball trigger point release to gluteal and lumbar spine                   PT Education - 02/04/20 0954    Education Details HEP, POC, plan going forward    Person(s) Educated Patient    Methods Explanation;Demonstration;Tactile cues;Verbal cues;Handout    Comprehension Verbalized understanding;Returned demonstration;Verbal cues required;Tactile cues required  PT Short Term Goals - 02/04/20 1021      PT SHORT TERM GOAL #1   Title Patient will increase pain free left hip flexion to 105 degrees    Time 3    Period Weeks    Status New    Target Date 02/25/20      PT SHORT TERM GOAL #2   Title Patient will increase pain free left hip flexion by 15 degrees    Time 3    Period Weeks     Status New    Target Date 02/25/20      PT SHORT TERM GOAL #3   Title Patient will be independent with HEP for basic stretching and strenggthening    Time 3    Period Weeks    Status New    Target Date 02/25/20             PT Long Term Goals - 02/04/20 1148      PT LONG TERM GOAL #1   Title Patient will go up/down 6 steps without increased pain    Time 6    Period Weeks    Status New    Target Date 03/17/20      PT LONG TERM GOAL #2   Title Patient will sleep through the night without increased pain    Time 6    Period Weeks    Status New    Target Date 03/17/20      PT LONG TERM GOAL #3   Title Patient will demonstrate a 41% limitation on FOTO    Time 6    Period Weeks    Status New    Target Date 03/17/20                 Plan - 02/04/20 0957    Clinical Impression Statement Patient is a 69 year old female with bilateral lower back pain left > right. She has the most pain when she is sleeping at night. Over the past week she has had less pain floowing a session in a massgae chair. She has also been on prednisone which is likley a cause of her improved pain as well. She she has significat spasming in her left glutreals and lumbar spine. She has increased pain with hip flexion on the left past 90 derees. She has limited lumbar flexion and extension. She would benefit from skilled therapy to develope a stretching and exercise regemine to improve hip and lukbar motion.    Examination-Activity Limitations Sleep    Stability/Clinical Decision Making Evolving/Moderate complexity    Clinical Decision Making Moderate    Rehab Potential Good    PT Frequency 1x / week    PT Duration 6 weeks    PT Treatment/Interventions ADLs/Self Care Home Management;Electrical Stimulation;Iontophoresis 4mg /ml Dexamethasone;Moist Heat    PT Next Visit Plan consder PA's to L4 L5; soft tissue mobilization to lumbar/gluteals; doeds not like lying supine or prone; revieww core breathing  and adsd light ther-ex. Progress core strengthening as tolerated.    PT Home Exercise Plan piriformis stretch; tennis ball trigger point release; hamstring stretch    Consulted and Agree with Plan of Care Patient           Patient will benefit from skilled therapeutic intervention in order to improve the following deficits and impairments:  Increased muscle spasms, Decreased range of motion, Increased fascial restricitons, Decreased activity tolerance, Decreased mobility, Pain  Visit Diagnosis: Chronic midline low back pain without sciatica  Abnormal  posture  Muscle weakness (generalized)  Cervicalgia     Problem List Patient Active Problem List   Diagnosis Date Noted   Other intervertebral disc degeneration, lumbar region 12/09/2018   Syncope 01/29/2018   Diarrhea 09/01/2017   Weight loss, non-intentional 09/01/2017   Cervicalgia 04/18/2016   Vertigo 08/19/2014   Anxiety disorder 08/19/2014   Hot flashes 08/19/2014   Mass of right side of neck 06/17/2014   right lower back mass 06/30/2012   Neuroendocrine tumor 11/19/2011    Carney Living PT DPT  02/04/2020, 12:10 PM  [facility] [address] [place], [place], [address] Phone: [phone]   Fax:  [phone]  Name: Samantha Martinez MRN: [id number] Date of Birth: April 10, 1951

## 2020-02-10 ENCOUNTER — Ambulatory Visit: Payer: Medicare HMO

## 2020-02-10 ENCOUNTER — Other Ambulatory Visit: Payer: Self-pay

## 2020-02-10 DIAGNOSIS — M545 Low back pain, unspecified: Secondary | ICD-10-CM | POA: Diagnosis not present

## 2020-02-10 DIAGNOSIS — G8929 Other chronic pain: Secondary | ICD-10-CM

## 2020-02-10 DIAGNOSIS — M6281 Muscle weakness (generalized): Secondary | ICD-10-CM

## 2020-02-10 DIAGNOSIS — R293 Abnormal posture: Secondary | ICD-10-CM

## 2020-02-10 DIAGNOSIS — M542 Cervicalgia: Secondary | ICD-10-CM

## 2020-02-10 NOTE — Therapy (Signed)
[place], [place], [address] Phone: [phone]   Fax:  [phone]  Physical Therapy Treatment  Patient Details  Name: Samantha Martinez OACZ-YSA [name] MRN: [id number] Date of Birth: 1950-09-29 Referring Provider (PT): Dr Jean Rosenthal    Encounter Date: 02/10/2020   PT End of Session - 02/10/20 0935    Visit Number 2    Number of Visits 6    Date for PT Re-Evaluation 03/17/20    Authorization Type Medicare [employer] No Ionto    PT Start Time [phone]    PT Stop Time 1014    PT Time Calculation (min) 43 min    Activity Tolerance Patient tolerated treatment well    Behavior During Therapy [facility] for tasks assessed/performed           Past Medical History:  Diagnosis Date  . Goiter   . Malignant carcinoid tumor of the ileum [facility])     Past Surgical History:  Procedure Laterality Date  . APPENDECTOMY    . COLPOSCOPY    . OVARIAN CYST REMOVAL    . THYROIDECTOMY      There were no vitals filed for this visit.   Subjective Assessment - 02/10/20 0932    Subjective Pt reports she still has spasms but not quite as intense since using tennis ball. She thinks the kids took it, so wonders if she can have a new one. Her brother found a shiatsu massage device on [employer] that she received yesterday and helps.    How long can you sit comfortably? works from home. Sits in a chair all day.    How long can you stand comfortably? Can feel pain in the lower back standing    How long can you walk comfortably? can ussualy walk unless she walks for a long period of time    Diagnostic tests MRI: Severe narrowing at L4-L5    Patient Stated Goals to have less pain in her back    Currently in Pain? Yes    Pain Score 5     Pain Location Back    Pain Orientation Left    Pain Descriptors / Indicators Aching    Pain Onset More than a month ago                             [facility] Adult PT Treatment/Exercise -  02/10/20 0001      Lumbar Exercises: Stretches   Active Hamstring Stretch 3 reps;20 seconds    Active Hamstring Stretch Limitations seated    Piriformis Stretch Limitations seated glute stretch 3x20 sec hold     Other Lumbar Stretch Exercise tennis ball trigger point release to gluteal and lumbar spine     Other Lumbar Stretch Exercise S/L book openers x 15 L      Manual Therapy   Manual Therapy Joint mobilization;Soft tissue mobilization;Passive ROM    Manual therapy comments prone, sidelying    Joint Mobilization L lumbar gapping grade 3-4    Soft tissue mobilization L L/S PS, QL, multifidi, glutes, piriformis    Passive ROM L hip IR/ER with DTM to piriformis                    PT Short Term Goals - 02/04/20 1021      PT SHORT TERM GOAL #1   Title Patient will increase pain free left hip flexion to 105 degrees  Time 3    Period Weeks    Status New    Target Date 02/25/20      PT SHORT TERM GOAL #2   Title Patient will increase pain free left hip flexion by 15 degrees    Time 3    Period Weeks    Status New    Target Date 02/25/20      PT SHORT TERM GOAL #3   Title Patient will be independent with HEP for basic stretching and strenggthening    Time 3    Period Weeks    Status New    Target Date 02/25/20             PT Long Term Goals - 02/04/20 1148      PT LONG TERM GOAL #1   Title Patient will go up/down 6 steps without increased pain    Time 6    Period Weeks    Status New    Target Date 03/17/20      PT LONG TERM GOAL #2   Title Patient will sleep through the night without increased pain    Time 6    Period Weeks    Status New    Target Date 03/17/20      PT LONG TERM GOAL #3   Title Patient will demonstrate a 41% limitation on FOTO    Time 6    Period Weeks    Status New    Target Date 03/17/20                 Plan - 02/10/20 0935    Clinical Impression Statement Pt continues to have some increased soft tissue tension to  L lower lumbar and gluteal area. She did tolerate prone lying with 1 pillow and R S/L well. She has some pain with transferring supine to sit, but less with R S/L to sit like a log. Added s/L open books and PPT to home program.    Examination-Activity Limitations Sleep    Stability/Clinical Decision Making Evolving/Moderate complexity    Rehab Potential Good    PT Frequency 1x / week    PT Duration 6 weeks    PT Treatment/Interventions ADLs/Self Care Home Management;Electrical Stimulation;Iontophoresis 4mg /ml Dexamethasone;Moist Heat    PT Next Visit Plan consder PA's to L4 L5; soft tissue mobilization to lumbar/gluteals; does not like lying supine or prone (did ok with prone + pillow); review core breathing and adsd light ther-ex. Progress core strengthening as tolerated.    PT Home Exercise Plan piriformis stretch; tennis ball trigger point release; hamstring stretch    Consulted and Agree with Plan of Care Patient           Patient will benefit from skilled therapeutic intervention in order to improve the following deficits and impairments:  Increased muscle spasms, Decreased range of motion, Increased fascial restricitons, Decreased activity tolerance, Decreased mobility, Pain  Visit Diagnosis: Chronic midline low back pain without sciatica  Abnormal posture  Muscle weakness (generalized)  Cervicalgia     Problem List Patient Active Problem List   Diagnosis Date Noted  . Other intervertebral disc degeneration, lumbar region 12/09/2018  . Syncope 01/29/2018  . Diarrhea 09/01/2017  . Weight loss, non-intentional 09/01/2017  . Cervicalgia 04/18/2016  . Vertigo 08/19/2014  . Anxiety disorder 08/19/2014  . Hot flashes 08/19/2014  . Mass of right side of neck 06/17/2014  . right lower back mass 06/30/2012  . Neuroendocrine tumor 11/19/2011    Samantha Martinez,  PT, DPT 02/10/2020, 10:16 AM  [facility] [address] [place], [place], [address] Phone: [phone]   Fax:  [phone]  Name: Samantha Martinez [name] MRN: [id number] Date of Birth: 10-07-1950

## 2020-02-10 NOTE — Patient Instructions (Signed)
Access Code: [email or web address] URL: [email or web address] Date: 02/10/2020 Prepared by: Kathreen Cornfield  Exercises Sidelying Thoracic and Shoulder Rotation - 1 x daily - 7 x weekly - 2 sets - 10 reps Supine Posterior Pelvic Tilt - 1 x daily - 7 x weekly - 2 sets - 10 reps - 5 seconds hold

## 2020-02-23 ENCOUNTER — Ambulatory Visit: Payer: Medicare HMO | Attending: Orthopaedic Surgery | Admitting: Physical Therapy

## 2020-02-23 ENCOUNTER — Encounter: Payer: Self-pay | Admitting: Physical Therapy

## 2020-02-23 ENCOUNTER — Other Ambulatory Visit: Payer: Self-pay

## 2020-02-23 DIAGNOSIS — M6281 Muscle weakness (generalized): Secondary | ICD-10-CM | POA: Diagnosis present

## 2020-02-23 DIAGNOSIS — M542 Cervicalgia: Secondary | ICD-10-CM | POA: Diagnosis present

## 2020-02-23 DIAGNOSIS — R293 Abnormal posture: Secondary | ICD-10-CM | POA: Diagnosis present

## 2020-02-23 DIAGNOSIS — G8929 Other chronic pain: Secondary | ICD-10-CM | POA: Insufficient documentation

## 2020-02-23 DIAGNOSIS — M545 Low back pain, unspecified: Secondary | ICD-10-CM | POA: Diagnosis present

## 2020-02-24 ENCOUNTER — Encounter: Payer: Self-pay | Admitting: Physical Therapy

## 2020-02-24 NOTE — Therapy (Signed)
[place], [place], [address] Phone: [phone]   Fax:  [phone]  Physical Therapy Treatment  Patient Details  Name: Samantha Martinez Samantha Martinez MRN: [id number] Date of Birth: 1950/04/28 Referring Provider (PT): Dr Jean Rosenthal    Encounter Date: 02/23/2020   PT End of Session - 02/24/20 1039    Visit Number 3    Number of Visits 6    Date for PT Re-Evaluation 03/17/20    Authorization Type Medicare [employer]na No Ionto    PT Start Time 1015    PT Stop Time 1057    PT Time Calculation (min) 42 min    Activity Tolerance Patient tolerated treatment well    Behavior During Therapy [facility] for tasks assessed/performed           Past Medical History:  Diagnosis Date  . Goiter   . Malignant carcinoid tumor of the ileum [facility])     Past Surgical History:  Procedure Laterality Date  . APPENDECTOMY    . COLPOSCOPY    . OVARIAN CYST REMOVAL    . THYROIDECTOMY      There were no vitals filed for this visit.   Subjective Assessment - 02/23/20 1027    Subjective Patient reports it is getting better but is still painful. She is having pain on the left side this morning. She isa using the stretches at home.    How long can you sit comfortably? works from home. Sits in a chair all day.    How long can you stand comfortably? Can feel pain in the lower back standing    How long can you walk comfortably? can ussualy walk unless she walks for a long period of time    Diagnostic tests MRI: Severe narrowing at L4-L5    Patient Stated Goals to have less pain in her back    Currently in Pain? Yes    Pain Score 4     Pain Location Back    Pain Orientation Left    Pain Descriptors / Indicators Aching    Pain Type Chronic pain    Pain Radiating Towards can radiate across the back and down    Pain Onset More than a month ago    Pain Frequency Constant    Aggravating Factors  lie in bed at night    Pain Relieving  Factors rest    Effect of Pain on Daily Activities difficulty sleeping                             [facility] Adult PT Treatment/Exercise - 02/24/20 0001      Lumbar Exercises: Stretches   Active Hamstring Stretch 3 reps;20 seconds    Active Hamstring Stretch Limitations seated    Piriformis Stretch Limitations seated glute stretch 3x20 sec hold       Lumbar Exercises: Machines for Strengthening   Other Lumbar Machine Exercise rows with abdominal breathing 2x15 15lbs     Other Lumbar Machine Exercise lat pull dow 2x15 15 lbs       Lumbar Exercises: Standing   Other Standing Lumbar Exercises biceps curl with abdominal bvreathing and good posture 2z10 3lb       Lumbar Exercises: Supine   Other Supine Lumbar Exercises reviewed abdoiminal breathing x10; supine clamshell 2x10 red       Manual Therapy   Manual therapy comments LAD to bilateral LE     Soft  tissue mobilization side lying hip pirifromis trigger point release                   PT Education - 02/24/20 1038    Education Details updated HEP    Person(s) Educated Patient    Methods Explanation;Demonstration;Tactile cues;Verbal cues    Comprehension Verbalized understanding;Returned demonstration;Verbal cues required;Tactile cues required            PT Short Term Goals - 02/04/20 1021      PT SHORT TERM GOAL #1   Title Patient will increase pain free left hip flexion to 105 degrees    Time 3    Period Weeks    Status New    Target Date 02/25/20      PT SHORT TERM GOAL #2   Title Patient will increase pain free left hip flexion by 15 degrees    Time 3    Period Weeks    Status New    Target Date 02/25/20      PT SHORT TERM GOAL #3   Title Patient will be independent with HEP for basic stretching and strenggthening    Time 3    Period Weeks    Status New    Target Date 02/25/20             PT Long Term Goals - 02/04/20 1148      PT LONG TERM GOAL #1   Title Patient will go  up/down 6 steps without increased pain    Time 6    Period Weeks    Status New    Target Date 03/17/20      PT LONG TERM GOAL #2   Title Patient will sleep through the night without increased pain    Time 6    Period Weeks    Status New    Target Date 03/17/20      PT LONG TERM GOAL #3   Title Patient will demonstrate a 41% limitation on FOTO    Time 6    Period Weeks    Status New    Target Date 03/17/20                 Plan - 02/24/20 1040    Clinical Impression Statement patient has been working on her stretches. She continues to have a trigger pojnt in her gluteal but it has improved. Therapy suggested needling but she has had a bad experience in the past. She plans on starting at the gy, Therapy geared her core exercises towards gym exercises. She tolerated well. The weights were challenging but not painful.    Examination-Activity Limitations Sleep    Stability/Clinical Decision Making Evolving/Moderate complexity    Clinical Decision Making Moderate    Rehab Potential Good    PT Frequency 1x / week    PT Duration 6 weeks    PT Treatment/Interventions ADLs/Self Care Home Management;Electrical Stimulation;Iontophoresis 4mg /ml Dexamethasone;Moist Heat    PT Next Visit Plan consder PA's to L4 L5; soft tissue mobilization to lumbar/gluteals; does not like lying supine or prone (did ok with prone + pillow); review core breathing and adsd light ther-ex. Progress core strengthening as tolerated.    PT Home Exercise Plan piriformis stretch; tennis ball trigger point release; hamstring stretch    Consulted and Agree with Plan of Care Patient           Patient will benefit from skilled therapeutic intervention in order to improve the following deficits and impairments:  Increased muscle spasms, Decreased range of motion, Increased fascial restricitons, Decreased activity tolerance, Decreased mobility, Pain  Visit Diagnosis: Chronic midline low back pain without  sciatica  Abnormal posture  Muscle weakness (generalized)  Cervicalgia     Problem List Patient Active Problem List   Diagnosis Date Noted  . Other intervertebral disc degeneration, lumbar region 12/09/2018  . Syncope 01/29/2018  . Diarrhea 09/01/2017  . Weight loss, non-intentional 09/01/2017  . Cervicalgia 04/18/2016  . Vertigo 08/19/2014  . Anxiety disorder 08/19/2014  . Hot flashes 08/19/2014  . Mass of right side of neck 06/17/2014  . right lower back mass 06/30/2012  . Neuroendocrine tumor 11/19/2011    Carney Living PT DPT  02/24/2020, 11:05 AM  [facility] [address] [place], [place], [address] Phone: [phone]   Fax:  [phone]  Name: Samantha Martinez Samantha Martinez MRN: [id number] Date of Birth: June 12, 1950

## 2020-03-01 ENCOUNTER — Other Ambulatory Visit: Payer: Self-pay

## 2020-03-01 ENCOUNTER — Ambulatory Visit: Payer: Medicare HMO | Admitting: Physical Therapy

## 2020-03-01 ENCOUNTER — Encounter: Payer: Self-pay | Admitting: Physical Therapy

## 2020-03-01 DIAGNOSIS — M545 Low back pain, unspecified: Secondary | ICD-10-CM

## 2020-03-01 DIAGNOSIS — R293 Abnormal posture: Secondary | ICD-10-CM

## 2020-03-01 DIAGNOSIS — M6281 Muscle weakness (generalized): Secondary | ICD-10-CM

## 2020-03-01 DIAGNOSIS — M542 Cervicalgia: Secondary | ICD-10-CM

## 2020-03-01 NOTE — Therapy (Addendum)
[place], [place], [address] Phone: [phone]   Fax:  [phone]  Physical Therapy Treatment/Discharge   Patient Details  Name: Samantha Martinez OEUM-PNT [name] MRN: [id number] Date of Birth: 1950/07/03 Referring Provider (PT): Dr Jean Rosenthal    Encounter Date: 03/01/2020   PT End of Session - 03/01/20 1110    Visit Number 4    Number of Visits 6    Date for PT Re-Evaluation 03/17/20    Authorization Type Medicare Aetna No Ionto    PT Start Time 1103    PT Stop Time 1145    PT Time Calculation (min) 42 min    Activity Tolerance Patient tolerated treatment well    Behavior During Therapy [facility] for tasks assessed/performed           Past Medical History:  Diagnosis Date  . Goiter   . Malignant carcinoid tumor of the ileum [facility])     Past Surgical History:  Procedure Laterality Date  . APPENDECTOMY    . COLPOSCOPY    . OVARIAN CYST REMOVAL    . THYROIDECTOMY      There were no vitals filed for this visit.   Subjective Assessment - 03/01/20 1107    Subjective Patient reports her pain today is around a 5/10. She woke up in pain. She reports she felt pretty good after the last session. The pain countues to go up and down.    How long can you stand comfortably? Can feel pain in the lower back standing    How long can you walk comfortably? can ussualy walk unless she walks for a long period of time    Diagnostic tests MRI: Severe narrowing at L4-L5    Patient Stated Goals to have less pain in her back    Currently in Pain? Yes    Pain Score 5     Pain Location Back    Pain Orientation Left    Pain Descriptors / Indicators Aching    Pain Type Chronic pain    Pain Onset More than a month ago    Pain Frequency Constant    Aggravating Factors  lie in the bed all night    Pain Relieving Factors rest    Effect of Pain on Daily Activities difficuty sleeping                              [facility] Adult PT Treatment/Exercise - 03/01/20 0001      Lumbar Exercises: Stretches   Active Hamstring Stretch 3 reps;20 seconds    Active Hamstring Stretch Limitations seated    Lower Trunk Rotation Limitations x15 bilateral       Lumbar Exercises: Standing   Other Standing Lumbar Exercises Standig march 2x10     Other Standing Lumbar Exercises standing 3 way hip with abdominal breathing x10       Lumbar Exercises: Seated   Other Seated Lumbar Exercises ball squeeze with abdominal breathing x15       Manual Therapy   Manual therapy comments LAD to bilateral LE     Soft tissue mobilization side lying hip pirifromis trigger point release                   PT Education - 03/01/20 1108    Education Details reviewed technique with stretching    Person(s) Educated Patient    Methods Explanation;Demonstration;Tactile cues;Verbal cues  Comprehension Verbalized understanding;Returned demonstration;Verbal cues required;Tactile cues required            PT Short Term Goals - 02/04/20 1021      PT SHORT TERM GOAL #1   Title Patient will increase pain free left hip flexion to 105 degrees    Time 3    Period Weeks    Status New    Target Date 02/25/20      PT SHORT TERM GOAL #2   Title Patient will increase pain free left hip flexion by 15 degrees    Time 3    Period Weeks    Status New    Target Date 02/25/20      PT SHORT TERM GOAL #3   Title Patient will be independent with HEP for basic stretching and strenggthening    Time 3    Period Weeks    Status New    Target Date 02/25/20             PT Long Term Goals - 02/04/20 1148      PT LONG TERM GOAL #1   Title Patient will go up/down 6 steps without increased pain    Time 6    Period Weeks    Status New    Target Date 03/17/20      PT LONG TERM GOAL #2   Title Patient will sleep through the night without increased pain    Time 6    Period Weeks    Status New     Target Date 03/17/20      PT LONG TERM GOAL #3   Title Patient will demonstrate a 41% limitation on FOTO    Time 6    Period Weeks    Status New    Target Date 03/17/20                 Plan - 03/01/20 1111    Clinical Impression Statement Patient continues to make good progress. She had some tightness in her gluteals and lumbar spine today but it was closer to her spine. She tolerated manual therapy well. therapy gave her a standing serries  of exercises for home. She had no increase in pain.    Examination-Activity Limitations Sleep    Stability/Clinical Decision Making Evolving/Moderate complexity    Clinical Decision Making Moderate    PT Duration 6 weeks    PT Treatment/Interventions ADLs/Self Care Home Management;Electrical Stimulation;Iontophoresis 33m/ml Dexamethasone;Moist Heat    PT Next Visit Plan consder PA's to L4 L5; soft tissue mobilization to lumbar/gluteals; does not like lying supine or prone (did ok with prone + pillow); review core breathing and adsd light ther-ex. Progress core strengthening as tolerated.    PT Home Exercise Plan piriformis stretch; tennis ball trigger point release; hamstring stretch    Consulted and Agree with Plan of Care Patient           Patient will benefit from skilled therapeutic intervention in order to improve the following deficits and impairments:  Increased muscle spasms, Decreased range of motion, Increased fascial restricitons, Decreased activity tolerance, Decreased mobility, Pain  Visit Diagnosis: Chronic midline low back pain without sciatica  Abnormal posture  Muscle weakness (generalized)  Cervicalgia  PHYSICAL THERAPY DISCHARGE SUMMARY  Visits from Start of Care: 4  Current functional level related to goals / functional outcomes: Did not return for follow up   Remaining deficits: Unknown   Education / Equipment: HEP   Plan: Patient agrees to discharge.  Patient goals were not met. Patient is being  discharged due to meeting the stated rehab goals.  ?????       Problem List Patient Active Problem List   Diagnosis Date Noted  . Other intervertebral disc degeneration, lumbar region 12/09/2018  . Syncope 01/29/2018  . Diarrhea 09/01/2017  . Weight loss, non-intentional 09/01/2017  . Cervicalgia 04/18/2016  . Vertigo 08/19/2014  . Anxiety disorder 08/19/2014  . Hot flashes 08/19/2014  . Mass of right side of neck 06/17/2014  . right lower back mass 06/30/2012  . Neuroendocrine tumor 11/19/2011    Carney Living  PT DPT 03/01/2020, 11:58 AM  [facility] [address] [place], [place], [address] Phone: [phone]   Fax:  [phone]  Name: Hadasa Gasner Sapp-Van [name] MRN: [id number] Date of Birth: 06-07-1950

## 2020-03-08 ENCOUNTER — Ambulatory Visit: Payer: Medicare HMO | Admitting: Physical Therapy

## 2020-05-10 ENCOUNTER — Other Ambulatory Visit: Payer: Self-pay | Admitting: Physician Assistant

## 2020-05-10 ENCOUNTER — Ambulatory Visit
Admission: RE | Admit: 2020-05-10 | Discharge: 2020-05-10 | Disposition: A | Discharge status: Home / Self Care | Payer: Self-pay | Source: Ambulatory Visit | Attending: Physician Assistant | Admitting: Physician Assistant

## 2020-05-10 DIAGNOSIS — R152 Fecal urgency: Secondary | ICD-10-CM

## 2020-07-13 IMAGING — CR DG HIP (WITH OR WITHOUT PELVIS) 2-3V*L*
3 series · 3 of 3 positions shown · non-contrast
Comparison: None.

CLINICAL DATA: Left hip pain after fall.

EXAM:
DG HIP (WITH OR WITHOUT PELVIS) 2-3V LEFT

[t pelvis ap]
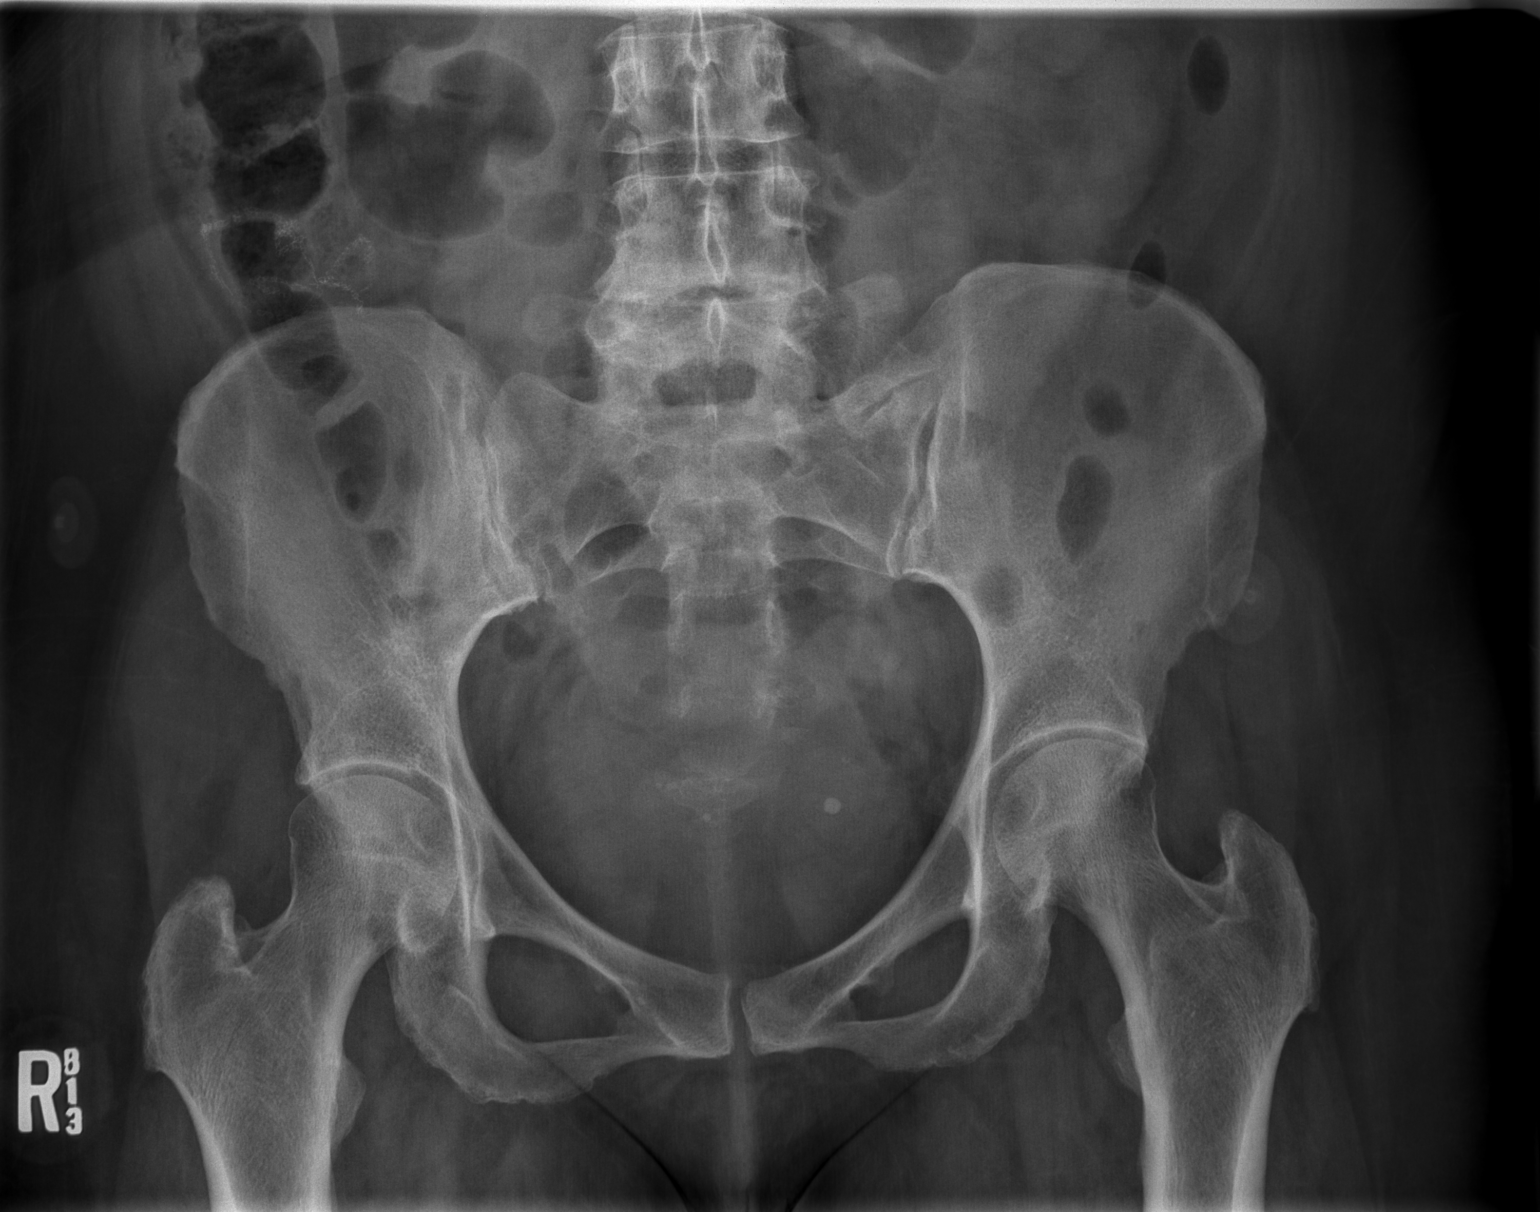

[t hip ap left]
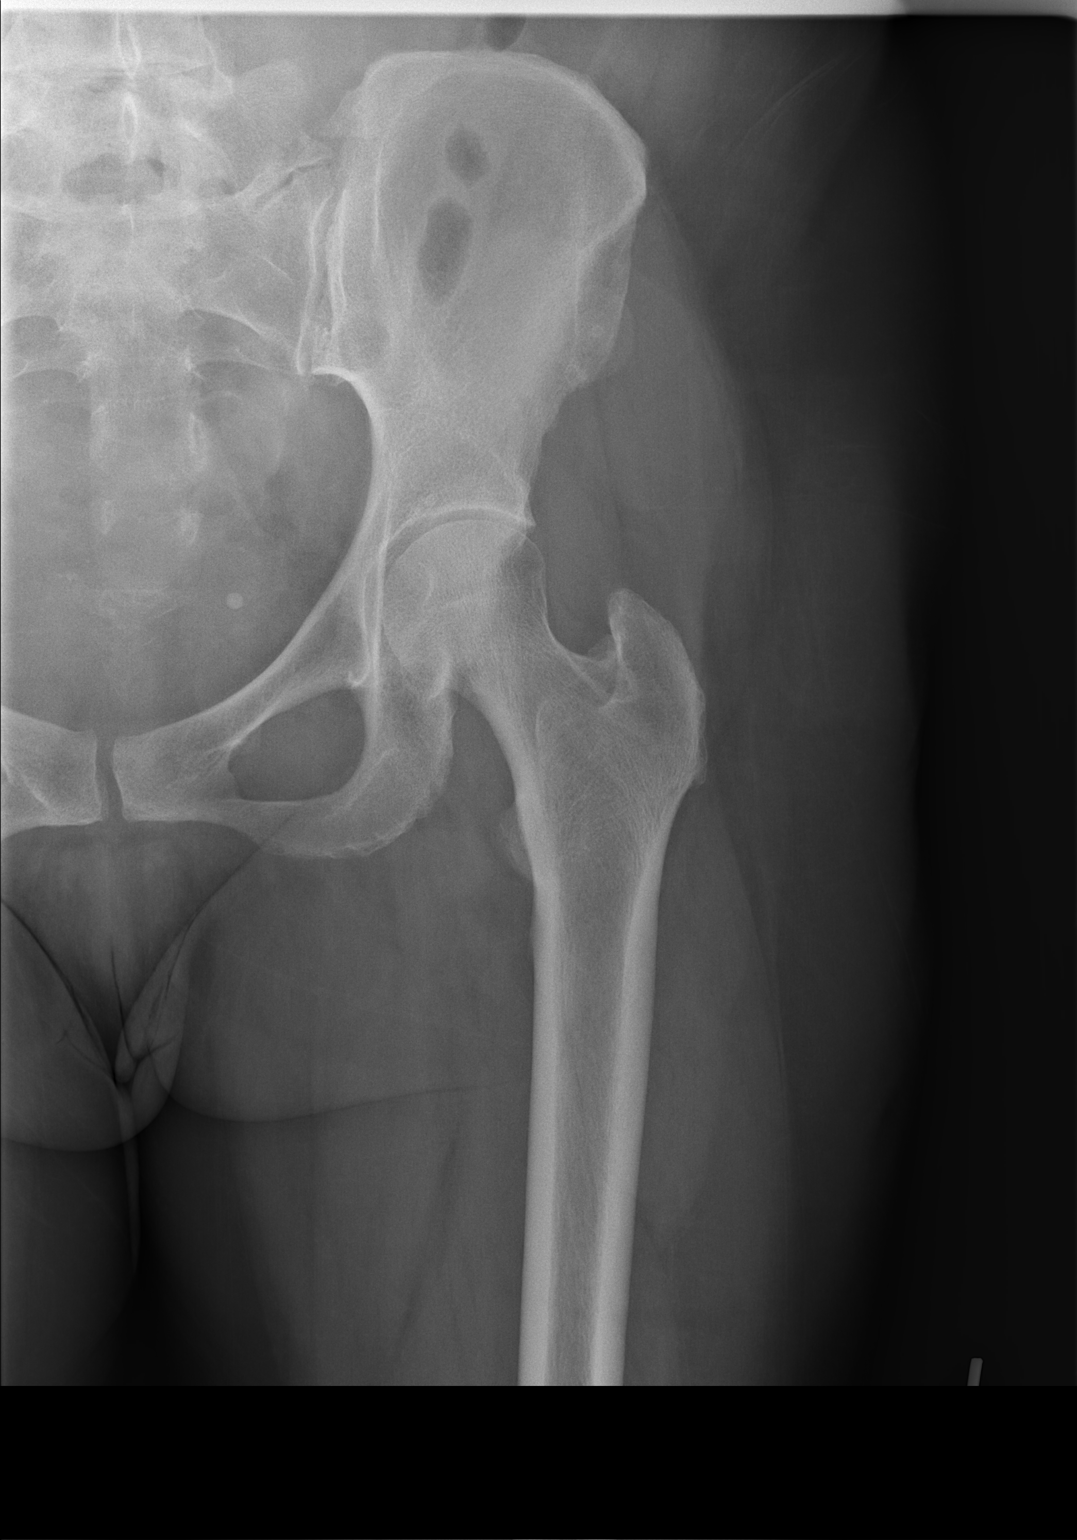

[t hip frog leg left]
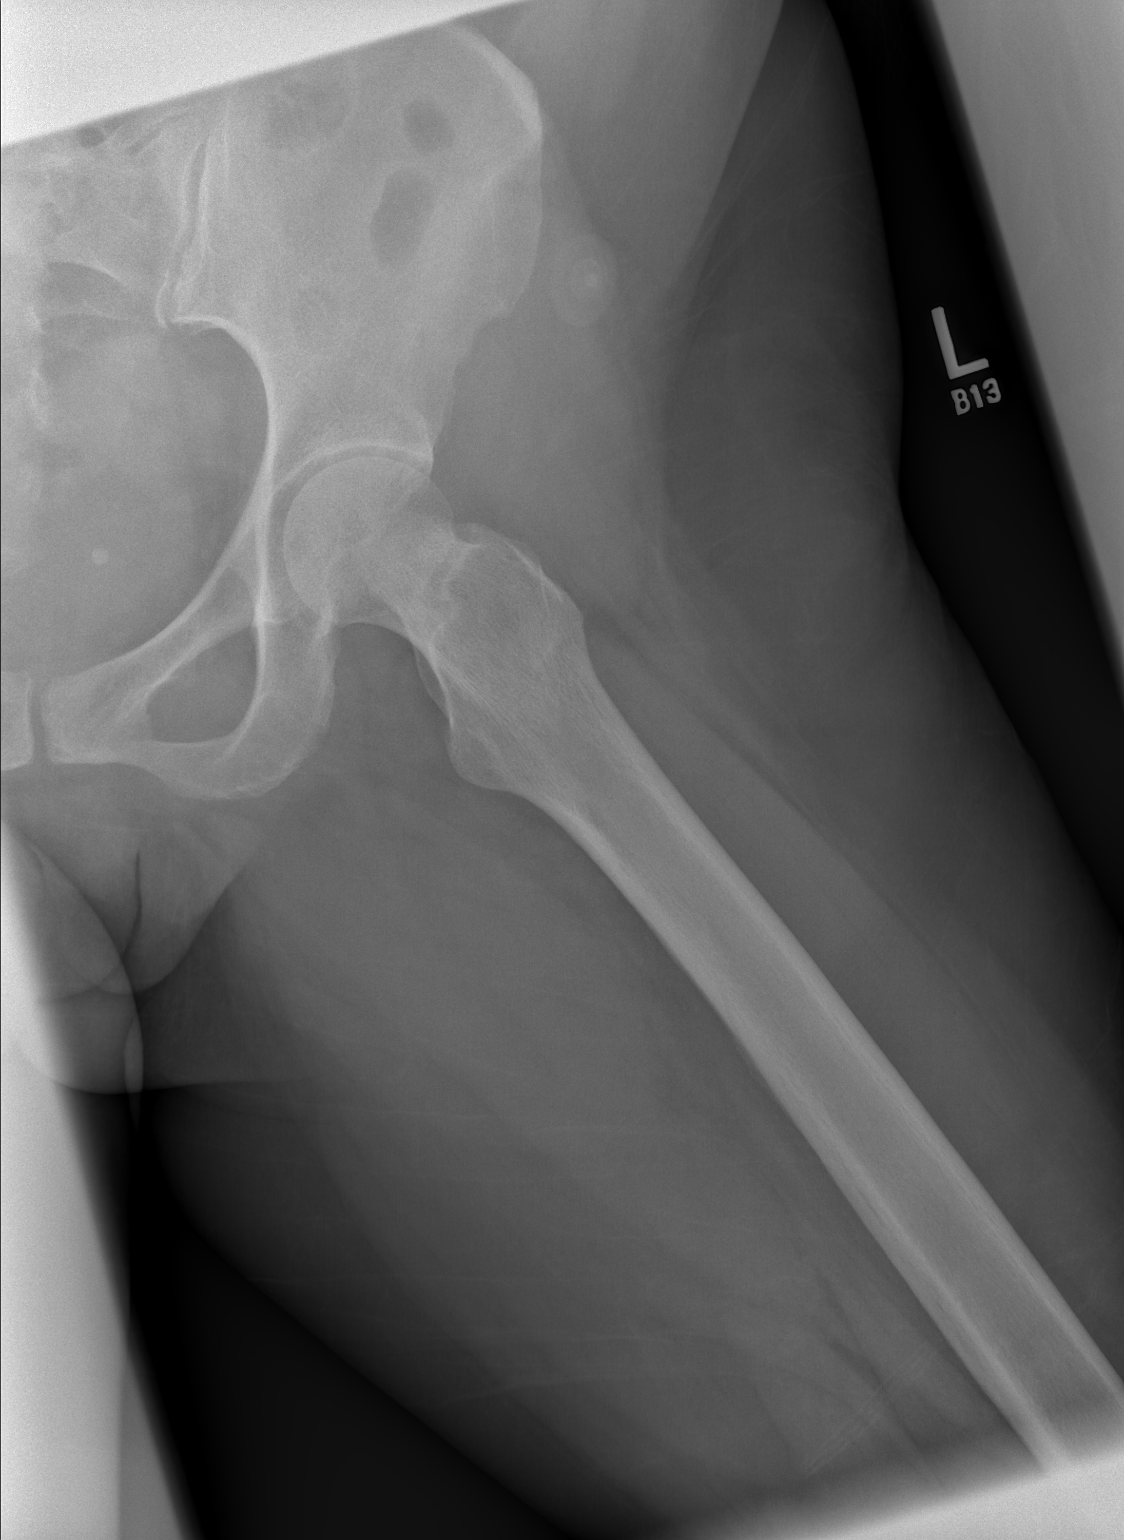

[3 of 3 positions shown; findings below may reference images not displayed]

FINDINGS: There is no evidence of hip fracture or dislocation. There is no
evidence of arthropathy or other focal bone abnormality.
IMPRESSION: Negative.

## 2020-07-13 IMAGING — CT CT HEAD W/O CM
3 of 6 series · 16 of 47 positions shown, 19 images · non-contrast
Comparison: None.

CLINICAL DATA: Recent syncopal episode with facial pain, initial
encounter

EXAM:
CT HEAD WITHOUT CONTRAST
CT MAXILLOFACIAL WITHOUT CONTRAST
TECHNIQUE: Multidetector CT imaging of the head and maxillofacial structures
were performed using the standard protocol without intravenous
contrast. Multiplanar CT image reconstructions of the maxillofacial
structures were also generated.

[Series 4: coronal soft tissue · coronal · 0.38mm/px · 3 of 66 slices shown]
[im 17/66  brain]
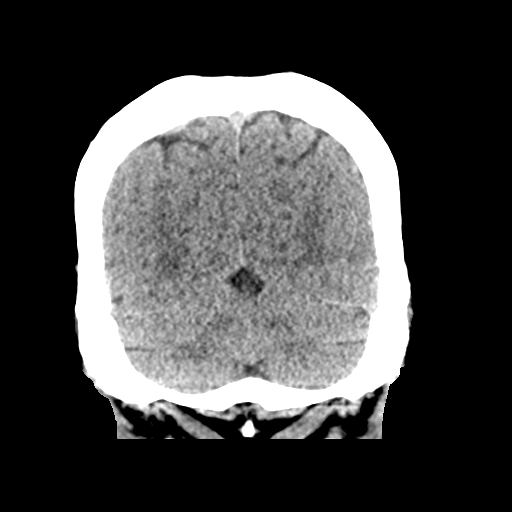
[im 33/66  brain]
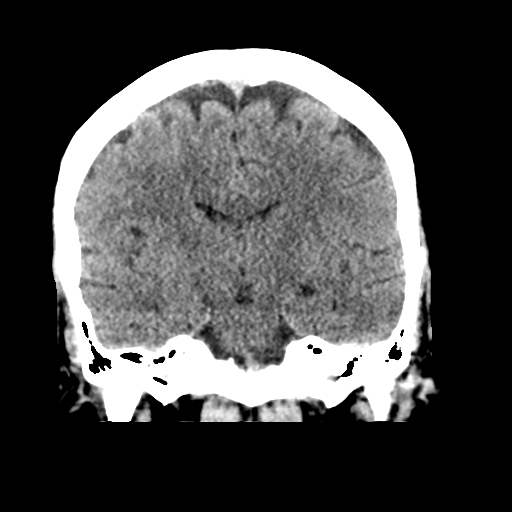
[im 49/66  brain]
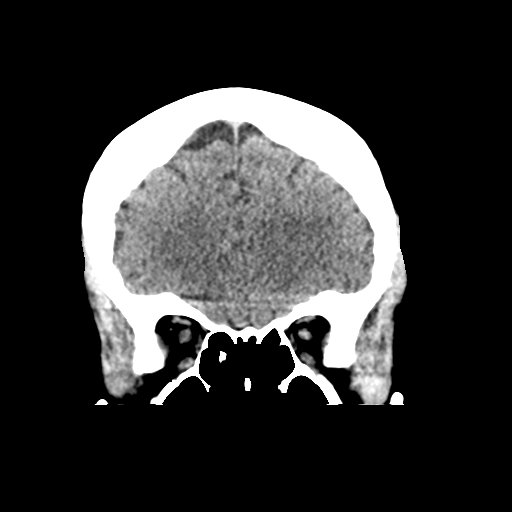

[Series 7: max soft · axial · 0.33mm/px · z∈[-183,-41]mm · 11 of 79 slices shown, 14 images]
[im 4/79  brain]
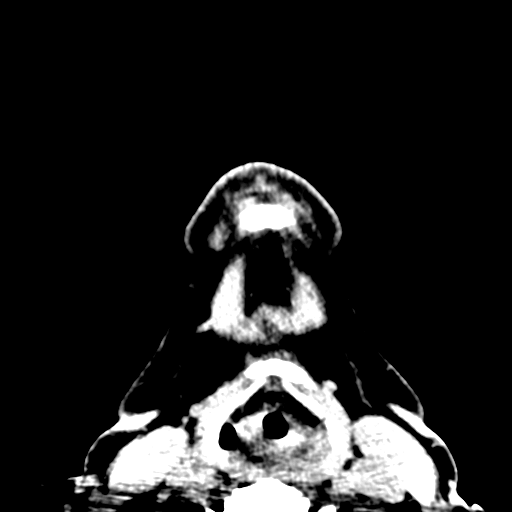
[im 4/79  bone]
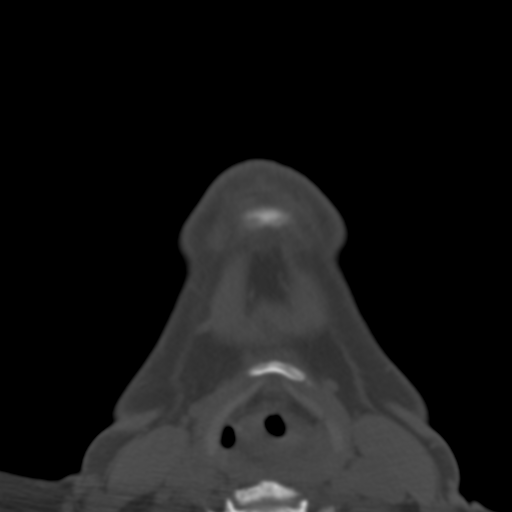
[im 12/79  brain]
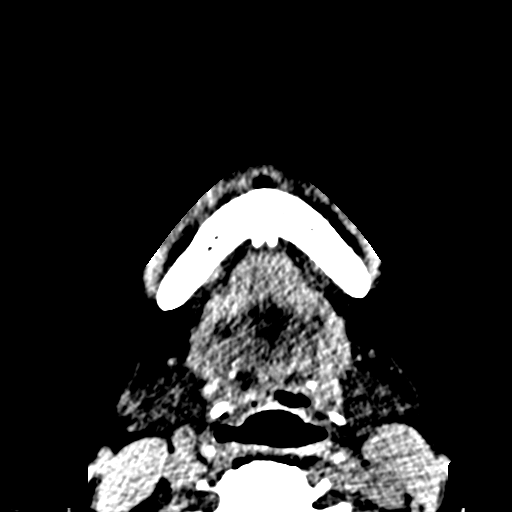
[im 19/79  brain]
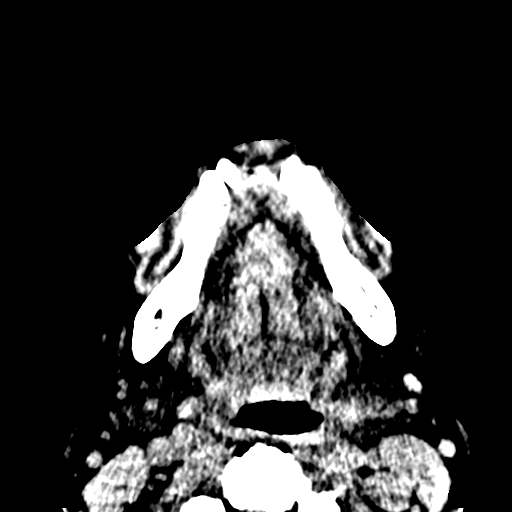
[im 27/79  brain]
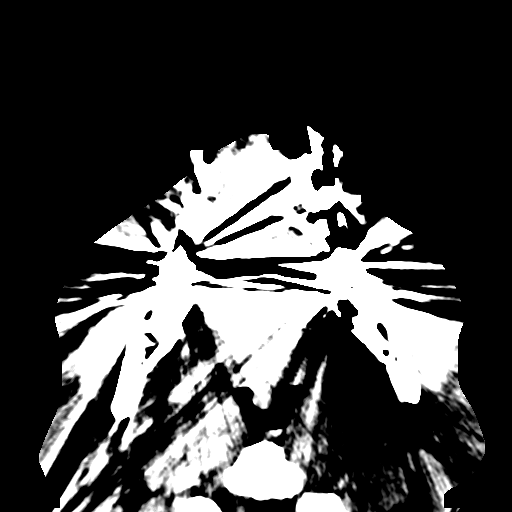
[im 34/79  brain]
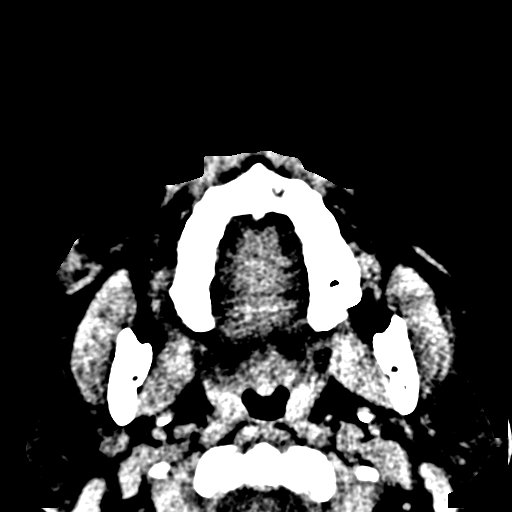
[im 34/79  bone]
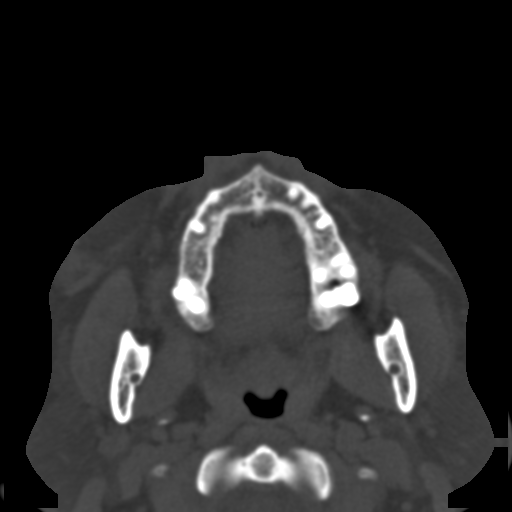
[im 41/79  brain]
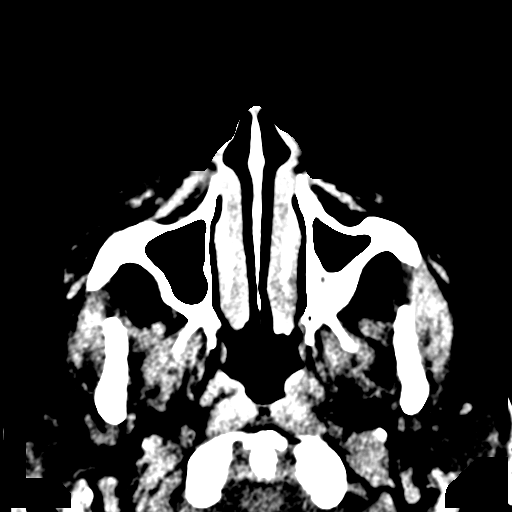
[im 45/79  brain]
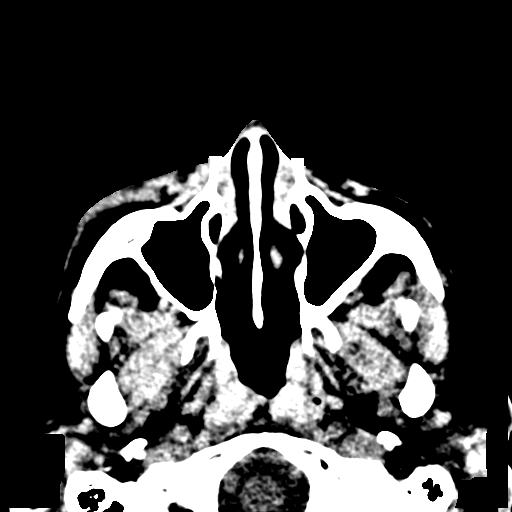
[im 53/79  brain]
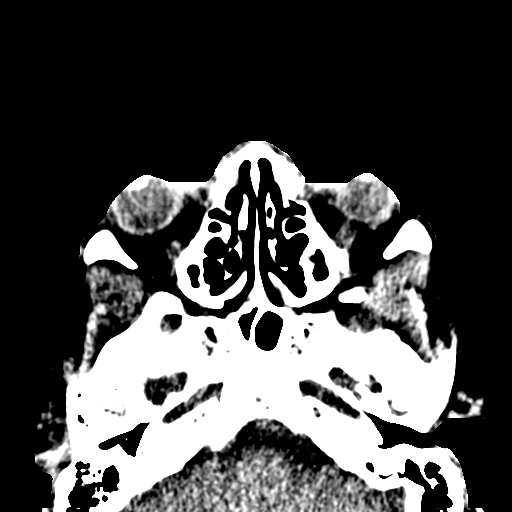
[im 60/79  brain]
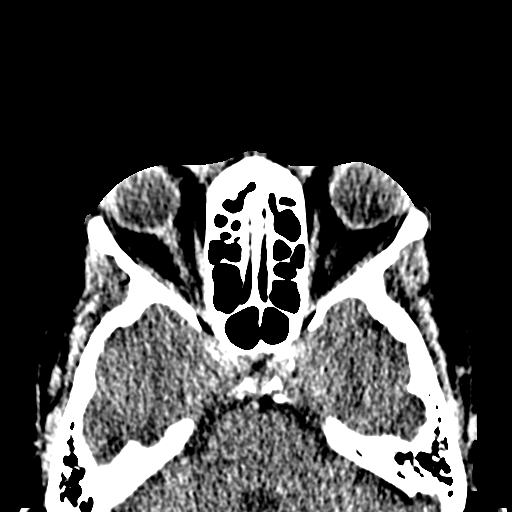
[im 60/79  bone]
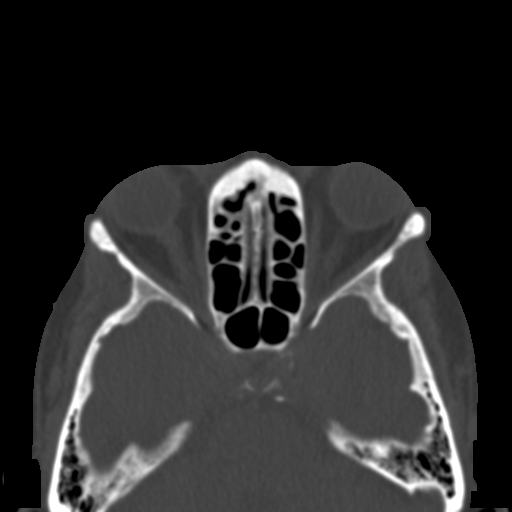
[im 67/79  brain]
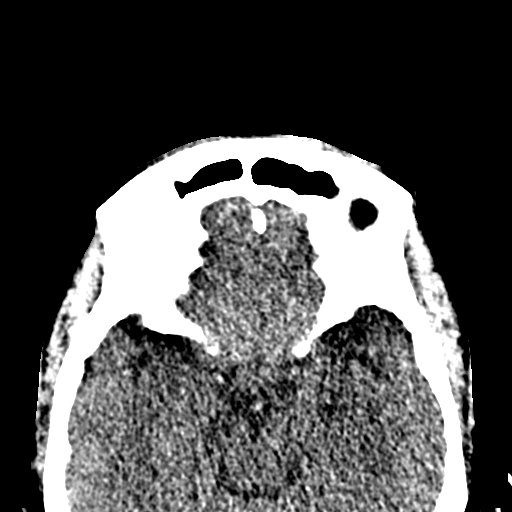
[im 75/79  brain]
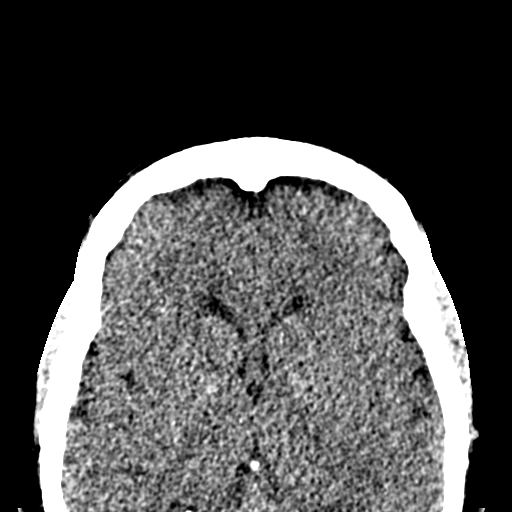

[Series 12: sagittal soft · sagittal · 0.34mm/px · 2 of 77 slices shown]
[im 26/77  brain]
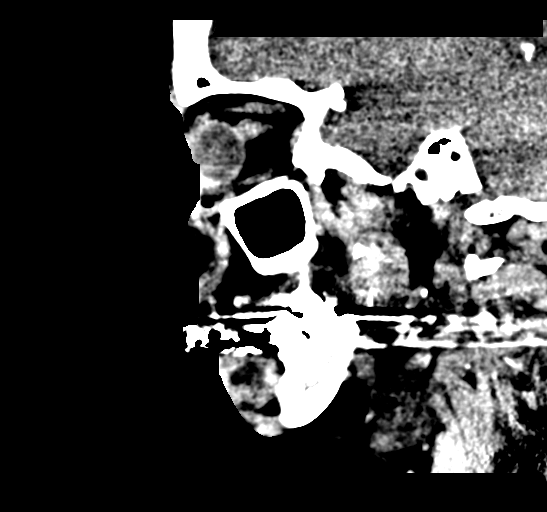
[im 51/77  brain]
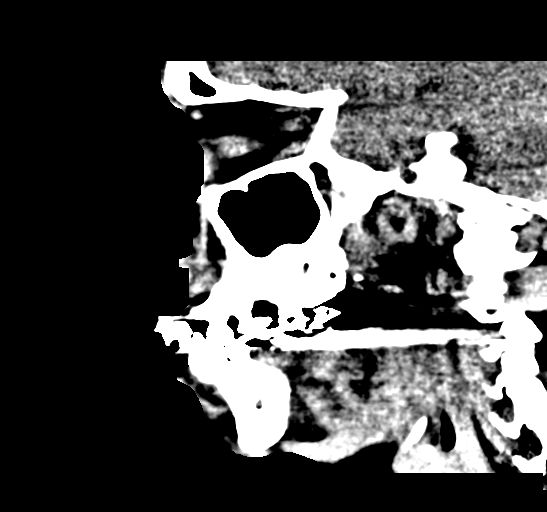

[16 of 47 positions shown; findings below may reference images not displayed]

FINDINGS: CT HEAD FINDINGS

Brain: No evidence of acute infarction, hemorrhage, hydrocephalus,
extra-axial collection or mass lesion/mass effect.

Vascular: No hyperdense vessel or unexpected calcification.

Skull: Normal. Negative for fracture or focal lesion.

Other: Mild soft tissue swelling is noted lateral to the right orbit
consistent with the recent injury.

CT MAXILLOFACIAL FINDINGS

Osseous: No acute osseous abnormality is noted. Considerable dental
hardware is noted.

Orbits: Orbits and their contents are within normal limits.

Sinuses: Paranasal sinuses are well aerated.

Soft tissues: Soft tissue swelling is noted lateral to the right
orbit and inferior to the right orbit consistent with the recent
injury. A small amount of air is noted in the subcutaneous tissue
consistent with the recent laceration.
IMPRESSION: CT of the head: No acute intracranial abnormality noted.

CT of the maxillofacial bones: No acute bony abnormality is noted.

Soft tissue changes consistent with the recent injury are seen on
the right.

## 2020-07-21 ENCOUNTER — Other Ambulatory Visit: Payer: Self-pay | Admitting: Internal Medicine

## 2020-07-21 DIAGNOSIS — Z1231 Encounter for screening mammogram for malignant neoplasm of breast: Secondary | ICD-10-CM

## 2020-09-14 ENCOUNTER — Ambulatory Visit
Admission: RE | Admit: 2020-09-14 | Discharge: 2020-09-14 | Disposition: A | Discharge status: Home / Self Care | Payer: Medicare HMO | Source: Ambulatory Visit | Attending: Internal Medicine | Admitting: Internal Medicine

## 2020-09-14 ENCOUNTER — Other Ambulatory Visit: Payer: Self-pay

## 2020-09-14 DIAGNOSIS — Z1231 Encounter for screening mammogram for malignant neoplasm of breast: Secondary | ICD-10-CM

## 2021-01-11 ENCOUNTER — Other Ambulatory Visit: Payer: Self-pay | Admitting: Internal Medicine

## 2021-01-11 DIAGNOSIS — R928 Other abnormal and inconclusive findings on diagnostic imaging of breast: Secondary | ICD-10-CM

## 2021-01-19 ENCOUNTER — Other Ambulatory Visit: Payer: Self-pay | Admitting: Internal Medicine

## 2021-01-19 DIAGNOSIS — N644 Mastodynia: Secondary | ICD-10-CM

## 2021-01-19 DIAGNOSIS — N6310 Unspecified lump in the right breast, unspecified quadrant: Secondary | ICD-10-CM

## 2021-01-20 ENCOUNTER — Other Ambulatory Visit: Payer: Self-pay

## 2021-01-20 ENCOUNTER — Ambulatory Visit: Payer: Medicare HMO

## 2021-01-20 ENCOUNTER — Ambulatory Visit
Admission: RE | Admit: 2021-01-20 | Discharge: 2021-01-20 | Disposition: A | Discharge status: Home / Self Care | Payer: Medicare HMO | Source: Ambulatory Visit | Attending: Internal Medicine | Admitting: Internal Medicine

## 2021-01-20 DIAGNOSIS — N644 Mastodynia: Secondary | ICD-10-CM

## 2021-01-20 DIAGNOSIS — N6310 Unspecified lump in the right breast, unspecified quadrant: Secondary | ICD-10-CM

## 2021-02-23 ENCOUNTER — Telehealth: Payer: Self-pay | Admitting: Hematology and Oncology

## 2021-02-23 NOTE — Telephone Encounter (Signed)
Scheduled appt per 11/11 staff msg from Dr. Alvy Bimler. Pt is aware of appt date and time.

## 2021-03-15 ENCOUNTER — Encounter: Payer: Self-pay | Admitting: Hematology and Oncology

## 2021-03-15 ENCOUNTER — Other Ambulatory Visit: Payer: Self-pay

## 2021-03-15 ENCOUNTER — Inpatient Hospital Stay: Payer: Medicare HMO | Attending: Hematology and Oncology | Admitting: Hematology and Oncology

## 2021-03-15 DIAGNOSIS — D3A8 Other benign neuroendocrine tumors: Secondary | ICD-10-CM

## 2021-03-15 DIAGNOSIS — E663 Overweight: Secondary | ICD-10-CM | POA: Insufficient documentation

## 2021-03-15 DIAGNOSIS — E6609 Other obesity due to excess calories: Secondary | ICD-10-CM | POA: Diagnosis not present

## 2021-03-15 DIAGNOSIS — Z6830 Body mass index (BMI) 30.0-30.9, adult: Secondary | ICD-10-CM | POA: Diagnosis not present

## 2021-03-15 DIAGNOSIS — Z86018 Personal history of other benign neoplasm: Secondary | ICD-10-CM | POA: Diagnosis not present

## 2021-03-15 NOTE — Assessment & Plan Note (Signed)
Her last CT imaging from May 2021 showed no evidence of disease Clinically, her exam is benign I tried to reassure the patient that routine surveillance imaging studies are not indicated in the absence of symptoms We spent a lot of time discussing the importance of healthy lifestyle with dietary modification and exercise to build a stronger immune system and reduce risk of cancer recurrence I will see her again next year for further follow-up

## 2021-03-15 NOTE — Assessment & Plan Note (Signed)
I reviewed her recent test results that was done through her primary care doctor's office The patient has class I obesity and have primary hyperlipidemia We discussed the importance of dietary modification and exercise We discussed some after strategies to help her to lose weight She appears motivated and will try to lose on average half a pound to 1 pound per month and hopefully when I see her next year, she would have healthy lifestyle

## 2021-03-15 NOTE — Progress Notes (Signed)
[facility] CONSULT NOTE  Patient Care Team: Seward Carol, MD as PCP - General (Internal Medicine)  ASSESSMENT & PLAN:  Neuroendocrine tumor Her last CT imaging from May 2021 showed no evidence of disease Clinically, her exam is benign I tried to reassure the patient that routine surveillance imaging studies are not indicated in the absence of symptoms We spent a lot of time discussing the importance of healthy lifestyle with dietary modification and exercise to build a stronger immune system and reduce risk of cancer recurrence I will see her again next year for further follow-up  Class 1 obesity due to excess calories in adult I reviewed her recent test results that was done through her primary care doctor's office The patient has class I obesity and have primary hyperlipidemia We discussed the importance of dietary modification and exercise We discussed some after strategies to help her to lose weight She appears motivated and will try to lose on average half a pound to 1 pound per month and hopefully when I see her next year, she would have healthy lifestyle  No orders of the defined types were placed in this encounter.   The total time spent in the appointment was 55 minutes encounter with patients including review of chart and various tests results, discussions about plan of care and coordination of care plan   All questions were answered. The patient knows to call the clinic with any problems, questions or concerns. No barriers to learning was detected.  Samantha Lark, MD 12/1/20223:13 PM  CHIEF COMPLAINTS/PURPOSE OF CONSULTATION:  History of neuroendocrine tumor, status postresection She was last seen more than 3 years ago and is billed as a new patient today  HISTORY OF PRESENTING ILLNESS:  Samantha Martinez 70 y.o. 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 No acute findings within the abdomen or pelvis. 2. No evidence of recurrent tumor or metastatic disease.  Currently, she is not symptomatic except for occasional loose stool Denies abdominal pain No changes in bowel habits She has gained some weight since last time I saw her   MEDICAL HISTORY:  Past Medical History:  Diagnosis Date   Goiter    Malignant carcinoid tumor of the ileum [facility])     SURGICAL HISTORY: Past Surgical History:  Procedure Laterality Date   APPENDECTOMY     COLPOSCOPY     OVARIAN CYST REMOVAL     THYROIDECTOMY      SOCIAL HISTORY: Social History   Socioeconomic History   Marital status: Widowed    Spouse name: Not on file   Number of children: 1   Years of education: Not on file   Highest education level: Not on file  Occupational History   Not on file  Tobacco Use   Smoking status: Never   Smokeless tobacco: Never  Substance and Sexual Activity   Alcohol use: No   Drug use: No   Sexual activity: Not on file  Other Topics Concern   Not on  file  Social History Narrative   Not on file   Social Determinants of Health   Financial Resource Strain: Not on file  Food Insecurity: Not on file  Transportation Needs: Not on file  Physical Activity: Not on file  Stress: Not on file  Social Connections: Not on file  Intimate Partner Violence: Not on file    FAMILY HISTORY: Family History  Problem Relation Age of Onset   Cancer Mother        colon cancer    ALLERGIES:  is allergic to  latex.  MEDICATIONS:  Current Outpatient Medications  Medication Sig Dispense Refill   cholecalciferol (VITAMIN D3) 25 MCG (1000 UNIT) tablet Take 2,000 Units by mouth daily.     ALPRAZolam (XANAX) 0.5 MG tablet Take 0.5 mg by mouth at bedtime as needed for anxiety.     No current facility-administered medications for this visit.    REVIEW OF SYSTEMS:   Constitutional: Denies fevers, chills or abnormal night sweats Eyes: Denies blurriness of vision, double vision or watery eyes Ears, nose, mouth, throat, and face: Denies mucositis or sore throat Respiratory: Denies cough, dyspnea or wheezes Cardiovascular: Denies palpitation, chest discomfort or lower extremity swelling Skin: Denies abnormal skin rashes Lymphatics: Denies new lymphadenopathy or easy bruising Neurological:Denies numbness, tingling or new weaknesses Behavioral/Psych: Mood is stable, no new changes  All other systems were reviewed with the patient and are negative.  PHYSICAL EXAMINATION: ECOG PERFORMANCE STATUS: 1 - Symptomatic but completely ambulatory  Vitals:   03/15/21 1313  BP: (!) 158/91  Pulse: 82  Resp: 18  Temp: 98.1 F (36.7 C)  SpO2: 100%   Filed Weights   03/15/21 1313  Weight: 177 lb 6.4 oz (80.5 kg)    GENERAL:alert, no distress and comfortable SKIN: skin color, texture, turgor are normal, no rashes or significant lesions EYES: normal, conjunctiva are pink and non-injected, sclera clear OROPHARYNX:no exudate, no erythema and lips, buccal mucosa, and tongue normal  NECK: supple, thyroid normal size, non-tender, without nodularity LYMPH:  no palpable lymphadenopathy in the cervical, axillary or inguinal LUNGS: clear to auscultation and percussion with normal breathing effort HEART: regular rate & rhythm and no murmurs and no lower extremity edema ABDOMEN:abdomen soft, non-tender and normal bowel sounds Musculoskeletal:no cyanosis of digits and no clubbing  PSYCH: alert & oriented x 3 with  fluent speech NEURO: no focal motor/sensory deficits  LABORATORY DATA: I have reviewed data from her primary care doctor's office.  She has normal CBC, CMP, thyroid function but borderline elevated lipid panel I have reviewed the data as listed Lab Results  Component Value Date   WBC 7.0 01/30/2018   HGB 11.6 (L) 01/30/2018   HCT 36.1 01/30/2018   MCV 94.8 01/30/2018   PLT 206 01/30/2018   No results for input(s): NA, K, CL, CO2, GLUCOSE, BUN, CREATININE, CALCIUM, GFRNONAA, GFRAA, PROT, ALBUMIN, AST, ALT, ALKPHOS, BILITOT, BILIDIR, IBILI in the last 8760 hours.  RADIOGRAPHIC STUDIES: I have reviewed her prior CT imaging from 2021 I have personally reviewed the radiological images as listed and agreed with the findings in the report

## 2021-08-14 ENCOUNTER — Other Ambulatory Visit: Payer: Self-pay | Admitting: Obstetrics and Gynecology

## 2021-08-14 ENCOUNTER — Other Ambulatory Visit (HOSPITAL_COMMUNITY)
Admission: RE | Admit: 2021-08-14 | Discharge: 2021-08-14 | Disposition: A | Discharge status: Home / Self Care | Payer: Medicare HMO | Source: Ambulatory Visit | Attending: Obstetrics and Gynecology | Admitting: Obstetrics and Gynecology

## 2021-08-14 DIAGNOSIS — Z01419 Encounter for gynecological examination (general) (routine) without abnormal findings: Secondary | ICD-10-CM | POA: Diagnosis present

## 2021-08-16 LAB — CYTOLOGY - PAP
Comment: NEGATIVE
Diagnosis: NEGATIVE
High risk HPV: NEGATIVE

## 2021-08-23 ENCOUNTER — Other Ambulatory Visit: Payer: Self-pay | Admitting: Internal Medicine

## 2021-08-23 DIAGNOSIS — Z1231 Encounter for screening mammogram for malignant neoplasm of breast: Secondary | ICD-10-CM

## 2021-09-18 ENCOUNTER — Ambulatory Visit: Payer: Medicare HMO

## 2021-09-20 ENCOUNTER — Ambulatory Visit
Admission: RE | Admit: 2021-09-20 | Discharge: 2021-09-20 | Disposition: A | Discharge status: Home / Self Care | Payer: Medicare HMO | Source: Ambulatory Visit | Attending: Internal Medicine | Admitting: Internal Medicine

## 2021-09-20 DIAGNOSIS — Z1231 Encounter for screening mammogram for malignant neoplasm of breast: Secondary | ICD-10-CM

## 2022-03-15 ENCOUNTER — Encounter: Payer: Self-pay | Admitting: Hematology and Oncology

## 2022-03-15 ENCOUNTER — Inpatient Hospital Stay: Payer: Medicare HMO | Attending: Hematology and Oncology | Admitting: Hematology and Oncology

## 2022-03-15 ENCOUNTER — Other Ambulatory Visit: Payer: Self-pay

## 2022-03-15 VITALS — BP 153/90 | HR 63 | Temp 97.7°F | Resp 18 | Ht 64.0 in | Wt 171.8 lb

## 2022-03-15 DIAGNOSIS — Z6825 Body mass index (BMI) 25.0-25.9, adult: Secondary | ICD-10-CM | POA: Diagnosis not present

## 2022-03-15 DIAGNOSIS — Z86012 Personal history of benign carcinoid tumor: Secondary | ICD-10-CM | POA: Insufficient documentation

## 2022-03-15 DIAGNOSIS — E663 Overweight: Secondary | ICD-10-CM | POA: Diagnosis not present

## 2022-03-15 DIAGNOSIS — D3A8 Other benign neuroendocrine tumors: Secondary | ICD-10-CM | POA: Diagnosis not present

## 2022-03-15 NOTE — Assessment & Plan Note (Signed)
She has been successful and has lost some weight She will continue her weight loss journey

## 2022-03-15 NOTE — Progress Notes (Signed)
[facility] OFFICE PROGRESS NOTE  Patient Care Team: Seward Carol, MD as PCP - General (Internal Medicine)  ASSESSMENT & PLAN:  Neuroendocrine tumor Her last CT imaging from May 2021 showed no evidence of disease Clinically, her exam is benign I tried to reassure the patient that routine surveillance imaging studies are not indicated in the absence of symptoms We spent a lot of time discussing the importance of healthy lifestyle with dietary modification  I will see her again next year for further follow-up  Overweight (BMI 25.0-29.9) She has been successful and has lost some weight She will continue her weight loss journey  No orders of the defined types were placed in this encounter.   All questions were answered. The patient knows to call the clinic with any problems, questions or concerns. The total time spent in the appointment was 20 minutes encounter with patients including review of chart and various tests results, discussions about plan of care and coordination of care plan   Heath Lark, MD 03/15/2022 1:19 PM  INTERVAL HISTORY: Please see below for problem oriented charting. she returns for surveillance follow-up for remote history of neuroendocrine tumor She denies abdominal pain or changes in bowel habits She has successfully lost some weight She complains of chronic fatigue and have difficulties with stress from her work  REVIEW OF SYSTEMS:   Constitutional: Denies fevers, chills or abnormal weight loss Eyes: Denies blurriness of vision Ears, nose, mouth, throat, and face: Denies mucositis or sore throat Respiratory: Denies cough, dyspnea or wheezes Cardiovascular: Denies palpitation, chest discomfort or lower extremity swelling Gastrointestinal:  Denies nausea, heartburn or change in bowel habits Skin: Denies abnormal skin rashes Lymphatics: Denies new lymphadenopathy or easy bruising Neurological:Denies numbness, tingling or new  weaknesses Behavioral/Psych: Mood is stable, no new changes  All other systems were reviewed with the patient and are negative.  I have reviewed the past medical history, past surgical history, social history and family history with the patient and they are unchanged from previous note.  ALLERGIES:  is allergic to latex.  MEDICATIONS:  Current Outpatient Medications  Medication Sig Dispense Refill   ALPRAZolam (XANAX) 0.5 MG tablet Take 0.5 mg by mouth at bedtime as needed for anxiety.     cholecalciferol (VITAMIN D3) 25 MCG (1000 UNIT) tablet Take 2,000 Units by mouth daily.     No current facility-administered medications for this visit.    SUMMARY OF ONCOLOGIC HISTORY: Oncology History   No history exists.    PHYSICAL EXAMINATION: ECOG PERFORMANCE STATUS: 0 - Asymptomatic  Vitals:   03/15/22 1211  BP: (!) 153/90  Pulse: 63  Resp: 18  Temp: 97.7 F (36.5 C)  SpO2: 99%   Filed Weights   03/15/22 1211  Weight: 171 lb 12.8 oz (77.9 kg)    GENERAL:alert, no distress and comfortable SKIN: skin color, texture, turgor are normal, no rashes or significant lesions EYES: normal, Conjunctiva are pink and non-injected, sclera clear OROPHARYNX:no exudate, no erythema and lips, buccal mucosa, and tongue normal  NECK: supple, thyroid normal size, non-tender, without nodularity LYMPH:  no palpable lymphadenopathy in the cervical, axillary or inguinal LUNGS: clear to auscultation and percussion with normal breathing effort HEART: regular rate & rhythm and no murmurs and no lower extremity edema ABDOMEN:abdomen soft, non-tender and normal bowel sounds Musculoskeletal:no cyanosis of digits and no clubbing  NEURO: alert & oriented x 3 with fluent speech, no focal motor/sensory deficits  LABORATORY DATA:  I have reviewed the data as listed  Component Value Date/Time   NA 139 01/30/2018 0545   NA 141 08/19/2014 1304   K 3.6 01/30/2018 0545   K 3.6 08/19/2014 1304   CL 106  01/30/2018 0545   CL 103 05/15/2012 0912   CO2 26 01/30/2018 0545   CO2 24 08/19/2014 1304   GLUCOSE 103 (H) 01/30/2018 0545   GLUCOSE 98 08/19/2014 1304   GLUCOSE 84 05/15/2012 0912   BUN 15 01/30/2018 0545   BUN 11.2 09/01/2014 1553   CREATININE 0.70 01/30/2018 0545   CREATININE 0.8 09/01/2014 1553   CALCIUM 8.8 (L) 01/30/2018 0545   CALCIUM 9.2 08/19/2014 1304   PROT 6.9 01/30/2018 0545   PROT 7.7 08/19/2014 1304   ALBUMIN 3.3 (L) 01/30/2018 0545   ALBUMIN 3.8 08/19/2014 1304   AST 21 01/30/2018 0545   AST 18 08/19/2014 1304   ALT 16 01/30/2018 0545   ALT 19 08/19/2014 1304   ALKPHOS 48 01/30/2018 0545   ALKPHOS 71 08/19/2014 1304   BILITOT 0.7 01/30/2018 0545   BILITOT 0.41 08/19/2014 1304   GFRNONAA >60 01/30/2018 0545   GFRAA >60 01/30/2018 0545    No results found for: "SPEP", "UPEP"  Lab Results  Component Value Date   WBC 7.0 01/30/2018   NEUTROABS 5.9 01/29/2018   HGB 11.6 (L) 01/30/2018   HCT 36.1 01/30/2018   MCV 94.8 01/30/2018   PLT 206 01/30/2018      Chemistry      Component Value Date/Time   NA 139 01/30/2018 0545   NA 141 08/19/2014 1304   K 3.6 01/30/2018 0545   K 3.6 08/19/2014 1304   CL 106 01/30/2018 0545   CL 103 05/15/2012 0912   CO2 26 01/30/2018 0545   CO2 24 08/19/2014 1304   BUN 15 01/30/2018 0545   BUN 11.2 09/01/2014 1553   CREATININE 0.70 01/30/2018 0545   CREATININE 0.8 09/01/2014 1553      Component Value Date/Time   CALCIUM 8.8 (L) 01/30/2018 0545   CALCIUM 9.2 08/19/2014 1304   ALKPHOS 48 01/30/2018 0545   ALKPHOS 71 08/19/2014 1304   AST 21 01/30/2018 0545   AST 18 08/19/2014 1304   ALT 16 01/30/2018 0545   ALT 19 08/19/2014 1304   BILITOT 0.7 01/30/2018 0545   BILITOT 0.41 08/19/2014 1304

## 2022-03-15 NOTE — Assessment & Plan Note (Signed)
Her last CT imaging from May 2021 showed no evidence of disease Clinically, her exam is benign I tried to reassure the patient that routine surveillance imaging studies are not indicated in the absence of symptoms We spent a lot of time discussing the importance of healthy lifestyle with dietary modification  I will see her again next year for further follow-up

## 2022-08-30 ENCOUNTER — Other Ambulatory Visit: Payer: Self-pay | Admitting: Obstetrics and Gynecology

## 2022-08-30 DIAGNOSIS — Z1231 Encounter for screening mammogram for malignant neoplasm of breast: Secondary | ICD-10-CM

## 2022-09-24 ENCOUNTER — Ambulatory Visit
Admission: RE | Admit: 2022-09-24 | Discharge: 2022-09-24 | Disposition: A | Discharge status: Home / Self Care | Payer: Medicare HMO | Source: Ambulatory Visit | Attending: Obstetrics and Gynecology | Admitting: Obstetrics and Gynecology

## 2022-09-24 DIAGNOSIS — Z1231 Encounter for screening mammogram for malignant neoplasm of breast: Secondary | ICD-10-CM

## 2023-02-08 ENCOUNTER — Other Ambulatory Visit: Payer: Self-pay

## 2023-02-08 ENCOUNTER — Emergency Department (HOSPITAL_COMMUNITY): Payer: Medicare HMO

## 2023-02-08 ENCOUNTER — Encounter (HOSPITAL_COMMUNITY): Payer: Self-pay

## 2023-02-08 ENCOUNTER — Emergency Department (HOSPITAL_COMMUNITY)
Admission: EM | Admit: 2023-02-08 | Discharge: 2023-02-08 | Disposition: A | Discharge status: Home / Self Care | Payer: Medicare HMO | Attending: Emergency Medicine | Admitting: Emergency Medicine

## 2023-02-08 DIAGNOSIS — R519 Headache, unspecified: Secondary | ICD-10-CM | POA: Diagnosis present

## 2023-02-08 DIAGNOSIS — Z91040 Latex allergy status: Secondary | ICD-10-CM | POA: Insufficient documentation

## 2023-02-08 LAB — CBC
HCT: 38 % (ref 36.0–46.0)
Hemoglobin: 12.5 g/dL (ref 12.0–15.0)
MCH: 30.8 pg (ref 26.0–34.0)
MCHC: 32.9 g/dL (ref 30.0–36.0)
MCV: 93.6 fL (ref 80.0–100.0)
Platelets: 230 10*3/uL (ref 150–400)
RBC: 4.06 MIL/uL (ref 3.87–5.11)
RDW: 12.9 % (ref 11.5–15.5)
WBC: 5 10*3/uL (ref 4.0–10.5)
nRBC: 0 % (ref 0.0–0.2)

## 2023-02-08 LAB — COMPREHENSIVE METABOLIC PANEL
ALT: 18 U/L (ref 0–44)
AST: 21 U/L (ref 15–41)
Albumin: 4 g/dL (ref 3.5–5.0)
Alkaline Phosphatase: 65 U/L (ref 38–126)
Anion gap: 8 (ref 5–15)
BUN: 10 mg/dL (ref 8–23)
CO2: 22 mmol/L (ref 22–32)
Calcium: 8.8 mg/dL — ABNORMAL LOW (ref 8.9–10.3)
Chloride: 105 mmol/L (ref 98–111)
Creatinine, Ser: 0.68 mg/dL (ref 0.44–1.00)
GFR, Estimated: >60 mL/min (ref >60)
Glucose, Bld: 108 mg/dL — ABNORMAL HIGH (ref 70–99)
Potassium: 3.4 mmol/L — ABNORMAL LOW (ref 3.5–5.1)
Sodium: 135 mmol/L (ref 135–145)
Total Bilirubin: 0.6 mg/dL (ref 0.3–1.2)
Total Protein: 7.7 g/dL (ref 6.5–8.1)

## 2023-02-08 MED ORDER — METOCLOPRAMIDE HCL 5 MG/ML IJ SOLN
10.0000 mg | Freq: Once | INTRAMUSCULAR | Status: AC
  Administered 2023-02-08: 10 mg via INTRAVENOUS
  Filled 2023-02-08: qty 2

## 2023-02-08 MED ORDER — ACETAMINOPHEN 500 MG PO TABS
1000.0000 mg | ORAL_TABLET | Freq: Once | ORAL | Status: AC
  Administered 2023-02-08: 1000 mg via ORAL
  Filled 2023-02-08: qty 2

## 2023-02-08 MED ORDER — IOHEXOL 350 MG/ML SOLN
75.0000 mL | Freq: Once | INTRAVENOUS | Status: AC | PRN
  Administered 2023-02-08: 75 mL via INTRAVENOUS

## 2023-02-08 NOTE — Discharge Instructions (Signed)
Your CT scan showed a few chronically narrow vessels in your brain but no other abnormality. Your lab work showed slightly low potassium.  Neurology will call you to schedule follow up.  You should follow up with your primary care provider.  Return for severe pain, fever, or weakness or numbness.

## 2023-02-08 NOTE — ED Triage Notes (Signed)
Patient reports headache starting 2 days ago. Last took Excedrin this AM without relief. Denies hx of HTN, BP on arrival is 183/106. States she has had multiple migraines this year, usually she can control with over the counter medication. Neuro intact.

## 2023-02-08 NOTE — ED Provider Notes (Signed)
EMERGENCY DEPARTMENT AT [facility] Provider Note   CSN: [id number] Arrival date & time: 02/08/23  1541     History {Add pertinent medical, surgical, social history, OB history to HPI:1} Chief Complaint  Patient presents with   Headache    Samantha Martinez is a 72 y.o. female.   Headache 72 year old female present for headache.  She states she has had headache since yesterday.  Not sudden onset, frontal in the top of her head.  Is throbbing in nature.  She had episode in June where she had very severe similar headache more severe than today.  She was seen at an outside ED and had a CT head was unremarkable and a CTA head and neck which showed ectasia of the left MCA without true aneurysm.  She is try to follow-up with [place] but has not been able to get an appointment.  Since then she has had intermittent headaches similar to this.  Slightly worse today which is why she presents.  No vision changes or weakness or numbness.  Has been ambulate without difficulty.  No fevers or neck stiffness.     Home Medications Prior to Admission medications   Medication Sig Start Date End Date Taking? Authorizing Provider  ALPRAZolam Prudy Feeler) 0.5 MG tablet Take 0.5 mg by mouth at bedtime as needed for anxiety.    [provider]  cholecalciferol (VITAMIN D3) 25 MCG (1000 UNIT) tablet Take 2,000 Units by mouth daily.    [provider]      Allergies    Latex    Review of Systems   Review of Systems  Neurological:  Positive for headaches.  Review of systems completed and notable as per HPI.  ROS otherwise negative.   Physical Exam Updated Vital Signs BP (!) 170/95   Pulse 98   Temp 98.2 F (36.8 C) (Oral)   Resp 16   Ht 5\' 4"  (1.626 m)   Wt 79.4 kg   SpO2 91%   BMI 30.04 kg/m  Physical Exam Vitals and nursing note reviewed.  Constitutional:      General: She is not in acute distress.    Appearance: She is well-developed.  HENT:      Head: Normocephalic and atraumatic.  Eyes:     General: No visual field deficit.    Extraocular Movements: Extraocular movements intact.     Conjunctiva/sclera: Conjunctivae normal.     Pupils: Pupils are equal, round, and reactive to light.  Neck:     Meningeal: Brudzinski's sign and Kernig's sign absent.  Cardiovascular:     Rate and Rhythm: Normal rate and regular rhythm.     Heart sounds: No murmur heard. Pulmonary:     Effort: Pulmonary effort is normal. No respiratory distress.     Breath sounds: Normal breath sounds.  Abdominal:     Palpations: Abdomen is soft.     Tenderness: There is no abdominal tenderness.  Musculoskeletal:        General: No swelling.     Cervical back: Neck supple. No rigidity.  Skin:    General: Skin is warm and dry.     Capillary Refill: Capillary refill takes less than 2 seconds.  Neurological:     Mental Status: She is alert.     GCS: GCS eye subscore is 4. GCS verbal subscore is 5. GCS motor subscore is 6.     Cranial Nerves: No cranial nerve deficit, dysarthria or facial asymmetry.     Sensory:  No sensory deficit.     Motor: No weakness.     Coordination: Romberg sign negative. Coordination normal.     Gait: Gait normal.  Psychiatric:        Mood and Affect: Mood normal.     ED Results / Procedures / Treatments   Labs (all labs ordered are listed, but only abnormal results are displayed) Labs Reviewed  COMPREHENSIVE METABOLIC PANEL - Abnormal; Notable for the following components:      Result Value   Potassium 3.4 (*)    Glucose, Bld 108 (*)    Calcium 8.8 (*)    All other components within normal limits  CBC    EKG None  Radiology No results found.  Procedures Procedures  {Document cardiac monitor, telemetry assessment procedure when appropriate:1}  Medications Ordered in ED Medications - No data to display  ED Course/ Medical Decision Making/ A&P   {   Click here for ABCD2, HEART and other calculatorsREFRESH Note  before signing :1}                              Medical Decision Making Amount and/or Complexity of Data Reviewed Labs: ordered.   Medical Decision Making:   Gicela Sancen Martinez is a 72 y.o. female who presented to the ED today with recurrent headache.  Vital signs reviewed notable for hypertension.  On exam she has normal neurologic exam.  She has had similar headaches like this over the last few months intermittently.  She notes that in June, had a CT scan at the time with some ectasia of the MCA.  I will respect of subarachnoid given description of her headache, however given this abnormality and persistent headaches without clear cause we will plan to repeat CTA today for evaluation of developing a possible aneurysm.  Will treat symptomatically as well.   {crccomplexity:27900} Reviewed and confirmed nursing documentation for past medical history, family history, social history.  Initial Study Results:   Laboratory  All laboratory results reviewed.  Labs notable for ***  ***EKG EKG was reviewed independently. Rate, rhythm, axis, intervals all examined and without medically relevant abnormality. ST segments without concerns for elevations.    Radiology:  All images reviewed independently. ***Agree with radiology report at this time.      Consults: Case discussed with ***.   Reassessment and Plan:   ***    Patient's presentation is most consistent with {EM COPA:27473}     {Document critical care time when appropriate:1} {Document review of labs and clinical decision tools ie heart score, Chads2Vasc2 etc:1}  {Document your independent review of radiology images, and any outside records:1} {Document your discussion with family members, caretakers, and with consultants:1} {Document social determinants of health affecting pt's care:1} {Document your decision making why or why not admission, treatments were needed:1} Final Clinical Impression(s) / ED Diagnoses Final  diagnoses:  None    Rx / DC Orders ED Discharge Orders     None

## 2023-03-18 ENCOUNTER — Encounter: Payer: Self-pay | Admitting: Hematology and Oncology

## 2023-03-18 ENCOUNTER — Inpatient Hospital Stay: Payer: Medicare HMO | Admitting: Hematology and Oncology

## 2023-03-28 ENCOUNTER — Encounter: Payer: Self-pay | Admitting: Hematology and Oncology

## 2023-03-28 ENCOUNTER — Inpatient Hospital Stay: Payer: Medicare HMO

## 2023-03-28 ENCOUNTER — Inpatient Hospital Stay: Payer: Medicare HMO | Attending: Hematology and Oncology | Admitting: Hematology and Oncology

## 2023-03-28 ENCOUNTER — Other Ambulatory Visit: Payer: Medicare HMO

## 2023-03-28 VITALS — BP 132/73 | HR 96 | Temp 98.5°F | Resp 18 | Ht 64.0 in | Wt 174.8 lb

## 2023-03-28 DIAGNOSIS — D3A8 Other benign neuroendocrine tumors: Secondary | ICD-10-CM

## 2023-03-28 DIAGNOSIS — M545 Low back pain, unspecified: Secondary | ICD-10-CM | POA: Diagnosis not present

## 2023-03-28 DIAGNOSIS — R519 Headache, unspecified: Secondary | ICD-10-CM | POA: Insufficient documentation

## 2023-03-28 DIAGNOSIS — R197 Diarrhea, unspecified: Secondary | ICD-10-CM | POA: Diagnosis not present

## 2023-03-28 DIAGNOSIS — R222 Localized swelling, mass and lump, trunk: Secondary | ICD-10-CM | POA: Insufficient documentation

## 2023-03-28 LAB — CMP (CANCER CENTER ONLY)
ALT: 25 U/L (ref 0–44)
AST: 22 U/L (ref 15–41)
Albumin: 4.2 g/dL (ref 3.5–5.0)
Alkaline Phosphatase: 68 U/L (ref 38–126)
Anion gap: 6 (ref 5–15)
BUN: 13 mg/dL (ref 8–23)
CO2: 30 mmol/L (ref 22–32)
Calcium: 9.7 mg/dL (ref 8.9–10.3)
Chloride: 103 mmol/L (ref 98–111)
Creatinine: 0.78 mg/dL (ref 0.44–1.00)
GFR, Estimated: >60 mL/min (ref >60)
Glucose, Bld: 72 mg/dL (ref 70–99)
Potassium: 4.2 mmol/L (ref 3.5–5.1)
Sodium: 139 mmol/L (ref 135–145)
Total Bilirubin: 0.3 mg/dL (ref <1.2)
Total Protein: 8 g/dL (ref 6.5–8.1)

## 2023-03-28 LAB — URINALYSIS, COMPLETE (UACMP) WITH MICROSCOPIC
Bacteria, UA: NONE SEEN
Bilirubin Urine: NEGATIVE
Glucose, UA: NEGATIVE mg/dL
Hgb urine dipstick: NEGATIVE
Ketones, ur: NEGATIVE mg/dL
Leukocytes,Ua: NEGATIVE
Nitrite: NEGATIVE
Protein, ur: NEGATIVE mg/dL
Specific Gravity, Urine: 1.011 (ref 1.005–1.030)
pH: 6 (ref 5.0–8.0)

## 2023-03-28 LAB — CBC WITH DIFFERENTIAL (CANCER CENTER ONLY)
Abs Immature Granulocytes: 0.11 10*3/uL — ABNORMAL HIGH (ref 0.00–0.07)
Basophils Absolute: 0 10*3/uL (ref 0.0–0.1)
Basophils Relative: 0 %
Eosinophils Absolute: 0.1 10*3/uL (ref 0.0–0.5)
Eosinophils Relative: 1 %
HCT: 39.3 % (ref 36.0–46.0)
Hemoglobin: 12.9 g/dL (ref 12.0–15.0)
Immature Granulocytes: 1 %
Lymphocytes Relative: 38 %
Lymphs Abs: 3.4 10*3/uL (ref 0.7–4.0)
MCH: 30.5 pg (ref 26.0–34.0)
MCHC: 32.8 g/dL (ref 30.0–36.0)
MCV: 92.9 fL (ref 80.0–100.0)
Monocytes Absolute: 0.9 10*3/uL (ref 0.1–1.0)
Monocytes Relative: 10 %
Neutro Abs: 4.4 10*3/uL (ref 1.7–7.7)
Neutrophils Relative %: 50 %
Platelet Count: 285 10*3/uL (ref 150–400)
RBC: 4.23 MIL/uL (ref 3.87–5.11)
RDW: 12.7 % (ref 11.5–15.5)
WBC Count: 9 10*3/uL (ref 4.0–10.5)
nRBC: 0 % (ref 0.0–0.2)

## 2023-03-28 NOTE — Progress Notes (Signed)
[facility] OFFICE PROGRESS NOTE  Patient Care Team: Renford Dills, MD as PCP - General (Internal Medicine)  ASSESSMENT & PLAN:  Neuroendocrine tumor Examination is benign but due to her intermittent abdominal pain, increased flatulence and changes in bowel habits, I think it would warrant another CT imaging since her last scan was 3 months ago I will order blood work and CT imaging and I plan to see her after the new year  Diarrhea She has increased flatulence and loose bowel movement She also have lower back pain I will order blood work, urinalysis and CT imaging as above  Headache disorder She has severe recurrent headaches of unknown etiology I have reviewed her CT imaging which showed no evidence of intracranial pathology We discussed importance of regular exercise and better sleep hygiene I will defer to her primary care doctor and neurologist for management  Orders Placed This Encounter  Procedures   Urine Culture    Standing Status:   Future    Number of Occurrences:   1    Expiration Date:   03/27/2024   CT ABDOMEN PELVIS W CONTRAST    Standing Status:   Future    Expected Date:   04/11/2023    Expiration Date:   03/27/2024    If indicated for the ordered procedure, I authorize the administration of contrast media per Radiology protocol:   Yes    Does the patient have a contrast media/X-ray dye allergy?:   No    Preferred imaging location?:   [facility] [place]    If indicated for the ordered procedure, I authorize the administration of oral contrast media per Radiology protocol:   Yes   CBC with Differential (Cancer Center Only)    Standing Status:   Future    Number of Occurrences:   1    Expiration Date:   03/27/2024   CMP (Cancer Center only)    Standing Status:   Future    Number of Occurrences:   1    Expiration Date:   03/27/2024   Urinalysis, Complete w Microscopic    Standing Status:   Future    Number of Occurrences:   1    Expiration  Date:   03/27/2024    All questions were answered. The patient knows to call the clinic with any problems, questions or concerns. The total time spent in the appointment was 40 minutes encounter with patients including review of chart and various tests results, discussions about plan of care and coordination of care plan   Artis Delay, MD 03/28/2023 1:33 PM  INTERVAL HISTORY: Please see below for problem oriented charting. she returns for surveillance follow-up for history of neuroendocrine tumor status post resection Since last time I saw her, she have recurrent hospitalization due to severe headaches of unknown etiology I have reviewed her CT imaging results The patient has retired since then She stated that her current life situation is nonstressful She does not sleep well She has intermittent back pain as well as increased flatulence with loose bowel movement recently  REVIEW OF SYSTEMS:   Constitutional: Denies fevers, chills or abnormal weight loss Eyes: Denies blurriness of vision Ears, nose, mouth, throat, and face: Denies mucositis or sore throat Respiratory: Denies cough, dyspnea or wheezes Cardiovascular: Denies palpitation, chest discomfort or lower extremity swelling Skin: Denies abnormal skin rashes Lymphatics: Denies new lymphadenopathy or easy bruising Neurological:Denies numbness, tingling or new weaknesses Behavioral/Psych: Mood is stable, no new changes  All other systems  were reviewed with the patient and are negative.  I have reviewed the past medical history, past surgical history, social history and family history with the patient and they are unchanged from previous note.  ALLERGIES:  is allergic to latex.  MEDICATIONS:  Current Outpatient Medications  Medication Sig Dispense Refill   ALPRAZolam (XANAX) 0.5 MG tablet Take 0.5 mg by mouth at bedtime as needed for anxiety.     cholecalciferol (VITAMIN D3) 25 MCG (1000 UNIT) tablet Take 2,000 Units by mouth  daily.     No current facility-administered medications for this visit.    SUMMARY OF ONCOLOGIC HISTORY: I have reviewed her chart and materials related to her cancer extensively and collaborated history with the patient. 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 No acute findings within the abdomen or pelvis. 2. No evidence of recurrent tumor or metastatic disease.  PHYSICAL EXAMINATION: ECOG PERFORMANCE STATUS: 1 - Symptomatic but completely ambulatory  Vitals:   03/28/23 1203  BP: 132/73  Pulse: 96  Resp: 18  Temp: 98.5 F (36.9 C)  SpO2: 98%   Filed Weights   03/28/23 1203  Weight: 174 lb 12.8 oz (79.3 kg)    GENERAL:alert, no distress and comfortable SKIN: skin color, texture, turgor are normal, no rashes or significant lesions EYES: normal, Conjunctiva are pink and non-injected, sclera clear OROPHARYNX:no exudate, no erythema and lips, buccal mucosa, and tongue normal  NECK: supple, thyroid normal size, non-tender, without nodularity LYMPH:  no palpable lymphadenopathy in the cervical, axillary or inguinal LUNGS: clear to auscultation and percussion with normal breathing effort HEART: regular rate & rhythm and no murmurs and no lower extremity edema ABDOMEN:abdomen soft, non-tender and normal bowel sounds Musculoskeletal:no cyanosis of digits and no clubbing  NEURO: alert & oriented x 3 with fluent speech, no focal motor/sensory deficits  LABORATORY DATA:  I have reviewed the data as listed    Component Value Date/Time   NA 135 02/08/2023 1619   NA 141 08/19/2014 1304   K 3.4 (L) 02/08/2023 1619   K 3.6 08/19/2014 1304   CL 105 02/08/2023 1619   CL 103 05/15/2012 0912   CO2 22 02/08/2023 1619   CO2 24 08/19/2014 1304   GLUCOSE 108  (H) 02/08/2023 1619   GLUCOSE 98 08/19/2014 1304   GLUCOSE 84 05/15/2012 0912   BUN 10 02/08/2023 1619   BUN 11.2 09/01/2014 1553   CREATININE 0.68 02/08/2023 1619   CREATININE 0.8 09/01/2014 1553   CALCIUM 8.8 (L) 02/08/2023 1619   CALCIUM 9.2 08/19/2014 1304   PROT 7.7 02/08/2023 1619   PROT 7.7 08/19/2014 1304   ALBUMIN 4.0 02/08/2023 1619   ALBUMIN 3.8 08/19/2014 1304   AST 21 02/08/2023 1619   AST 18 08/19/2014 1304   ALT 18 02/08/2023 1619   ALT 19 08/19/2014 1304   ALKPHOS 65 02/08/2023 1619   ALKPHOS 71 08/19/2014 1304   BILITOT 0.6 02/08/2023 1619   BILITOT 0.41 08/19/2014 1304   GFRNONAA >60 02/08/2023 1619   GFRAA >60 01/30/2018 0545    No results found for: "SPEP", "UPEP"  Lab Results  Component Value Date   WBC 9.0 03/28/2023   NEUTROABS 4.4 03/28/2023   HGB 12.9 03/28/2023   HCT 39.3 03/28/2023   MCV 92.9 03/28/2023   PLT 285  03/28/2023      Chemistry      Component Value Date/Time   NA 135 02/08/2023 1619   NA 141 08/19/2014 1304   K 3.4 (L) 02/08/2023 1619   K 3.6 08/19/2014 1304   CL 105 02/08/2023 1619   CL 103 05/15/2012 0912   CO2 22 02/08/2023 1619   CO2 24 08/19/2014 1304   BUN 10 02/08/2023 1619   BUN 11.2 09/01/2014 1553   CREATININE 0.68 02/08/2023 1619   CREATININE 0.8 09/01/2014 1553      Component Value Date/Time   CALCIUM 8.8 (L) 02/08/2023 1619   CALCIUM 9.2 08/19/2014 1304   ALKPHOS 65 02/08/2023 1619   ALKPHOS 71 08/19/2014 1304   AST 21 02/08/2023 1619   AST 18 08/19/2014 1304   ALT 18 02/08/2023 1619   ALT 19 08/19/2014 1304   BILITOT 0.6 02/08/2023 1619   BILITOT 0.41 08/19/2014 1304

## 2023-03-28 NOTE — Assessment & Plan Note (Signed)
She has increased flatulence and loose bowel movement She also have lower back pain I will order blood work, urinalysis and CT imaging as above

## 2023-03-28 NOTE — Assessment & Plan Note (Signed)
She has severe recurrent headaches of unknown etiology I have reviewed her CT imaging which showed no evidence of intracranial pathology We discussed importance of regular exercise and better sleep hygiene I will defer to her primary care doctor and neurologist for management

## 2023-03-28 NOTE — Assessment & Plan Note (Signed)
Examination is benign but due to her intermittent abdominal pain, increased flatulence and changes in bowel habits, I think it would warrant another CT imaging since her last scan was 3 months ago I will order blood work and CT imaging and I plan to see her after the new year

## 2023-03-29 LAB — URINE CULTURE: Culture: NO GROWTH

## 2023-04-11 ENCOUNTER — Ambulatory Visit (HOSPITAL_COMMUNITY)
Admission: RE | Admit: 2023-04-11 | Discharge: 2023-04-11 | Disposition: A | Discharge status: Home / Self Care | Payer: Medicare HMO | Source: Ambulatory Visit | Attending: Hematology and Oncology | Admitting: Hematology and Oncology

## 2023-04-11 DIAGNOSIS — R222 Localized swelling, mass and lump, trunk: Secondary | ICD-10-CM | POA: Insufficient documentation

## 2023-04-11 DIAGNOSIS — D3A8 Other benign neuroendocrine tumors: Secondary | ICD-10-CM | POA: Insufficient documentation

## 2023-04-11 MED ORDER — IOHEXOL 300 MG/ML  SOLN
30.0000 mL | Freq: Once | INTRAMUSCULAR | Status: AC | PRN
  Administered 2023-04-11: 30 mL via ORAL

## 2023-04-11 MED ORDER — IOHEXOL 300 MG/ML  SOLN
100.0000 mL | Freq: Once | INTRAMUSCULAR | Status: AC | PRN
  Administered 2023-04-11: 100 mL via INTRAVENOUS

## 2023-04-22 ENCOUNTER — Inpatient Hospital Stay: Payer: Medicare HMO | Attending: Hematology and Oncology | Admitting: Hematology and Oncology

## 2023-04-22 ENCOUNTER — Encounter: Payer: Self-pay | Admitting: Hematology and Oncology

## 2023-04-22 DIAGNOSIS — R197 Diarrhea, unspecified: Secondary | ICD-10-CM | POA: Diagnosis not present

## 2023-04-22 DIAGNOSIS — D3A8 Other benign neuroendocrine tumors: Secondary | ICD-10-CM

## 2023-04-22 NOTE — Assessment & Plan Note (Signed)
 The cause of her diarrhea or loose stool is unknown CT imaging show evidence of constipation with increased stool burden We discussed dietary changes and the use of regular laxatives She had recent colonoscopy which came back normal

## 2023-04-22 NOTE — Assessment & Plan Note (Signed)
 I have reviewed imaging studies with the patient I reassured the patient that we do not see signs of cancer recurrence Her abdominal symptoms are likely due to constipation I will see her back in a year for further follow-up

## 2023-04-22 NOTE — Progress Notes (Signed)
 HEMATOLOGY-ONCOLOGY ELECTRONIC VISIT PROGRESS NOTE  Patient Care Team: Rexanne Ingle, MD as PCP - General (Internal Medicine)  I connected with the patient via telephone conference and verified that I am speaking with the correct person using two identifiers. The patient's location is at home and I am providing care from the [facility] I discussed the limitations, risks, security and privacy concerns of performing an evaluation and management service by e-visits and the availability of in person appointments.  I also discussed with the patient that there may be a patient responsible charge related to this service. The patient expressed understanding and agreed to proceed.     ASSESSMENT & PLAN:  Neuroendocrine tumor I have reviewed imaging studies with the patient I reassured the patient that we do not see signs of cancer recurrence Her abdominal symptoms are likely due to constipation I will see her back in a year for further follow-up  Diarrhea The cause of her diarrhea or loose stool is unknown CT imaging show evidence of constipation with increased stool burden We discussed dietary changes and the use of regular laxatives She had recent colonoscopy which came back normal   No orders of the defined types were placed in this encounter.   INTERVAL HISTORY: Please see below for problem oriented charting. The purpose of today's discussion is to review CT imaging results She continues to have abdominal symptoms that comes and goes  SUMMARY OF ONCOLOGIC HISTORY:  I have reviewed her chart and materials related to her cancer extensively and collaborated history with the patient. 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 in September 2009, on observation  REVIEW OF SYSTEMS:   Constitutional: Denies fevers, chills or abnormal weight loss Eyes: Denies blurriness of vision Ears, nose, mouth,  throat, and face: Denies mucositis or sore throat Respiratory: Denies cough, dyspnea or wheezes Cardiovascular: Denies palpitation, chest discomfort Gastrointestinal:  Denies nausea, heartburn or change in bowel habits Skin: Denies abnormal skin rashes Lymphatics: Denies new lymphadenopathy or easy bruising Neurological:Denies numbness, tingling or new weaknesses Behavioral/Psych: Mood is stable, no new changes  Extremities: No lower extremity edema All other systems were reviewed with the patient and are negative.  I have reviewed the past medical history, past surgical history, social history and family history with the patient and they are unchanged from previous note.  ALLERGIES:  is allergic to latex.  MEDICATIONS:  Current Outpatient Medications  Medication Sig Dispense Refill   ALPRAZolam  (XANAX ) 0.5 MG tablet Take 0.5 mg by mouth at bedtime as needed for anxiety.     cholecalciferol (VITAMIN D3) 25 MCG (1000 UNIT) tablet Take 2,000 Units by mouth daily.     No current facility-administered medications for this visit.    PHYSICAL EXAMINATION: ECOG PERFORMANCE STATUS: 1 - Symptomatic but completely ambulatory  LABORATORY DATA:  I have reviewed the data as listed    Latest Ref Rng & Units 03/28/2023    1:05 PM 02/08/2023    4:19 PM 01/30/2018    5:45 AM  CMP  Glucose 70 - 99 mg/dL 72  891  896   BUN 8 - 23 mg/dL 13  10  15    Creatinine 0.44 - 1.00 mg/dL 9.21  9.31  9.29   Sodium 135 - 145 mmol/L 139  135  139   Potassium 3.5 - 5.1 mmol/L 4.2  3.4  3.6   Chloride 98 - 111 mmol/L 103  105  106  CO2 22 - 32 mmol/L 30  22  26    Calcium 8.9 - 10.3 mg/dL 9.7  8.8  8.8   Total Protein 6.5 - 8.1 g/dL 8.0  7.7  6.9   Total Bilirubin <1.2 mg/dL 0.3  0.6  0.7   Alkaline Phos 38 - 126 U/L 68  65  48   AST 15 - 41 U/L 22  21  21    ALT 0 - 44 U/L 25  18  16      Lab Results  Component Value Date   WBC 9.0 03/28/2023   HGB 12.9 03/28/2023   HCT 39.3 03/28/2023   MCV 92.9  03/28/2023   PLT 285 03/28/2023   NEUTROABS 4.4 03/28/2023     RADIOGRAPHIC STUDIES: I have reviewed imaging study with the patient I have personally reviewed the radiological images as listed and agreed with the findings in the report. CT ABDOMEN PELVIS W CONTRAST Result Date: 04/21/2023 CLINICAL DATA:  Neuroendocrine tumor of small bowel. Status post surgery. Change in bowel habit. Mass on back. * Tracking Code: BO * EXAM: CT ABDOMEN AND PELVIS WITH CONTRAST TECHNIQUE: Multidetector CT imaging of the abdomen and pelvis was performed using the standard protocol following bolus administration of intravenous contrast. RADIATION DOSE REDUCTION: This exam was performed according to the departmental dose-optimization program which includes automated exposure control, adjustment of the mA and/or kV according to patient size and/or use of iterative reconstruction technique. CONTRAST:  OMNIPAQUE  IOHEXOL  300 MG/ML  SOLN COMPARISON:  09/01/2019 FINDINGS: Lower chest: Bilateral lung base nodules of up to 2-3 mm are similar and considered benign. Normal heart size without pericardial or pleural effusion. Hepatobiliary: Lack of arterial phase imaging decreases sensitivity for hepatic metastasis in this patient with hypervascular primary. No liver metastasis, given this limitation. Normal gallbladder, without biliary ductal dilatation. Pancreas: Normal, without mass or ductal dilatation. Spleen: Normal in size, without focal abnormality. Adrenals/Urinary Tract: Normal adrenal glands. Small volume bilateral renal sinus cysts. No suspicious renal lesion or hydronephrosis. Normal urinary bladder. Stomach/Bowel: Normal stomach, without wall thickening. Colonic stool burden suggests constipation. Surgical sutures in the region of the ileocecal junction. No local recurrence. No small bowel obstruction, residual, or recurrent mass. Vascular/Lymphatic: Aortic atherosclerosis. No abdominopelvic adenopathy. Reproductive:  Calcified uterine fibroids are relatively small at maximally 1.0 cm. No adnexal mass. Other: No significant free fluid. Mild pelvic floor laxity. No free intraperitoneal air. No evidence of omental or peritoneal disease. Supraumbilical tiny fat containing ventral abdominal wall hernia including on 33/2. Musculoskeletal: Thoracolumbar spondylosis. IMPRESSION: 1. No evidence of recurrent or metastatic disease. Mildly decreased sensitivity for liver metastasis given lack of arterial phase imaging. 2. Possible constipation. No other explanation for altered bowel habits. 3. Incidental findings, including: Uterine fibroids. Aortic Atherosclerosis (ICD10-I70.0). Small fat containing supraumbilical hernia. 4. No soft tissue or osseous mass about the back identified. Electronically Signed   By: Rockey Kilts M.D.   On: 04/21/2023 19:36    I discussed the assessment and treatment plan with the patient. The patient was provided an opportunity to ask questions and all were answered. The patient agreed with the plan and demonstrated an understanding of the instructions. The patient was advised to call back or seek an in-person evaluation if the symptoms worsen or if the condition fails to improve as anticipated.    I spent 20 minutes for the appointment reviewing test results, discuss management and coordination of care.  Almarie Bedford, MD 04/22/2023 1:07 PM

## 2023-05-14 ENCOUNTER — Telehealth: Payer: Self-pay | Admitting: Neurology

## 2023-05-14 NOTE — Telephone Encounter (Signed)
[place]. Decided to be transferred to New Patient Referral for a neurologist with earlier appt than 07/2023

## 2023-05-15 ENCOUNTER — Ambulatory Visit: Payer: Self-pay | Admitting: Neurology

## 2023-05-20 ENCOUNTER — Encounter: Payer: Self-pay | Admitting: Neurology

## 2023-05-20 ENCOUNTER — Ambulatory Visit: Payer: Medicare HMO | Admitting: Neurology

## 2023-05-20 VITALS — BP 147/77 | HR 81 | Ht 64.0 in | Wt 177.0 lb

## 2023-05-20 DIAGNOSIS — G44219 Episodic tension-type headache, not intractable: Secondary | ICD-10-CM

## 2023-05-20 NOTE — Patient Instructions (Addendum)
Follow up with PCP  If you do have another headaches, please take Tylenol 1000 mg or Aleve 440 mg at home. If pain persist contact you PCP or go to UC.  Return as needed

## 2023-05-20 NOTE — Progress Notes (Signed)
 [facility]  PATIENT: Samantha Martinez DOB: [birth year]  REQUESTING CLINICIAN: Nicholaus Dorn LABOR, MD HISTORY FROM: Patient REASON FOR VISIT: Headaches   HISTORICAL  CHIEF COMPLAINT:  Chief Complaint  Patient presents with   New Patient (Initial Visit)    Rm12, alone. NP internal referral for Acute nonintractable headache, unspecified headache type: 0 in the past 30 days. Triggers: unidentifiable    HISTORY OF PRESENT ILLNESS:  This is a 73 year old woman with past medical history including hypertension and anxiety who is presenting for headaches. Patient report an episode of severe headache in June while in [place], started behind left eye, then radiate to the entire head. She went to Urgent Care, had head CT, was told there was something in the back and she needed to follow up Neurology. She was also medication with Morphine, Zofran and Acetaminophen . She felt better after the medication.  In October, she had another headache episode and presented to the ED, again head CT with no acute abnormality, no evidence of aneurysm or bleeding. She did not try any medication at home prior to presentation to the ED. She has not had any additional headaches since October.     OTHER MEDICAL CONDITIONS: Hypertension, Anxiety    REVIEW OF SYSTEMS: Full 14 system review of systems performed and negative with exception of: As noted in the HPI   ALLERGIES: Allergies  Allergen Reactions   Latex Hives, Itching and Rash    HOME MEDICATIONS: Outpatient Medications Prior to Visit  Medication Sig Dispense Refill   ALPRAZolam  (XANAX ) 0.5 MG tablet Take 0.5 mg by mouth at bedtime as needed for anxiety.     amLODipine (NORVASC) 5 MG tablet Take 5 mg by mouth daily.     cholecalciferol (VITAMIN D3) 25 MCG (1000 UNIT) tablet Take 2,000 Units by mouth daily.     rosuvastatin (CRESTOR) 5 MG tablet Take 5 mg by mouth daily.     No facility-administered medications prior  to visit.    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)     PAST SURGICAL HISTORY: Past Surgical History:  Procedure Laterality Date   APPENDECTOMY     COLPOSCOPY     OVARIAN CYST REMOVAL     THYROIDECTOMY      FAMILY HISTORY: Family History  Problem Relation Age of Onset   Cancer Mother        colon cancer    SOCIAL HISTORY: Social History   Socioeconomic History   Marital status: Widowed    Spouse name: Not on file   Number of children: 1   Years of education: Not on file   Highest education level: Not on file  Occupational History   Not on file  Tobacco Use   Smoking status: Never   Smokeless tobacco: Never  Substance and Sexual Activity   Alcohol use: No   Drug use: No   Sexual activity: Not on file  Other Topics Concern   Not on file  Social History Narrative   Not on file   Social Drivers of Health   Financial Resource Strain: Not on file  Food Insecurity: Not on file  Transportation Needs: Not on file  Physical Activity: Not on file  Stress: Not on file  Social Connections: Unknown (09/14/2022)   Received from [facility], [facility]   Social Network    Social Network: Not on file  Intimate  Partner Violence: Unknown (09/14/2022)   Received from [facility], [facility]   HITS    Physically Hurt: Not on file    Insult or Talk Down To: Not on file    Threaten Physical Harm: Not on file    Scream or Curse: Not on file    PHYSICAL EXAM  GENERAL EXAM/CONSTITUTIONAL: Vitals:  Vitals:   05/20/23 0812  BP: (!) 147/77  Pulse: 81  Weight: 177 lb (80.3 kg)  Height: 5' 4 (1.626 m)   Body mass index is 30.38 kg/m. Wt Readings from Last 3 Encounters:  05/20/23 177 lb (80.3 kg)  03/28/23 174 lb 12.8 oz (79.3 kg)  02/08/23 175 lb (79.4 kg)   Patient is in no distress; well developed, nourished and groomed; neck is supple  MUSCULOSKELETAL: Gait, strength,  tone, movements noted in Neurologic exam below  NEUROLOGIC: MENTAL STATUS:      No data to display         awake, alert, oriented to person, place and time recent and remote memory intact normal attention and concentration language fluent, comprehension intact, naming intact fund of knowledge appropriate  CRANIAL NERVE:  2nd, 3rd, 4th, 6th - pupils equal and reactive to light, visual fields full to confrontation, extraocular muscles intact, no nystagmus 5th - facial sensation symmetric 7th - facial strength symmetric 8th - hearing intact 9th - palate elevates symmetrically, uvula midline 11th - shoulder shrug symmetric 12th - tongue protrusion midline  MOTOR:  normal bulk and tone, full strength in the BUE, BLE  SENSORY:  normal and symmetric to light touch, pinprick  COORDINATION:  finger-nose-finger, fine finger movements normal  GAIT/STATION:  normal   DIAGNOSTIC DATA (LABS, IMAGING, TESTING) - I reviewed patient records, labs, notes, testing and imaging myself where available.  Lab Results  Component Value Date   WBC 9.0 03/28/2023   HGB 12.9 03/28/2023   HCT 39.3 03/28/2023   MCV 92.9 03/28/2023   PLT 285 03/28/2023      Component Value Date/Time   NA 139 03/28/2023 1305   NA 141 08/19/2014 1304   K 4.2 03/28/2023 1305   K 3.6 08/19/2014 1304   CL 103 03/28/2023 1305   CL 103 05/15/2012 0912   CO2 30 03/28/2023 1305   CO2 24 08/19/2014 1304   GLUCOSE 72 03/28/2023 1305   GLUCOSE 98 08/19/2014 1304   GLUCOSE 84 05/15/2012 0912   BUN 13 03/28/2023 1305   BUN 11.2 09/01/2014 1553   CREATININE 0.78 03/28/2023 1305   CREATININE 0.8 09/01/2014 1553   CALCIUM 9.7 03/28/2023 1305   CALCIUM 9.2 08/19/2014 1304   PROT 8.0 03/28/2023 1305   PROT 7.7 08/19/2014 1304   ALBUMIN 4.2 03/28/2023 1305   ALBUMIN 3.8 08/19/2014 1304   AST 22 03/28/2023 1305   AST 18 08/19/2014 1304   ALT 25 03/28/2023 1305   ALT 19 08/19/2014 1304   ALKPHOS 68 03/28/2023  1305   ALKPHOS 71 08/19/2014 1304   BILITOT 0.3 03/28/2023 1305   BILITOT 0.41 08/19/2014 1304   GFRNONAA >60 03/28/2023 1305   GFRAA >60 01/30/2018 0545   No results found for: CHOL, HDL, LDLCALC, LDLDIRECT, TRIG, CHOLHDL No results found for: YHAJ8R No results found for: VITAMINB12 Lab Results  Component Value Date   TSH 1.124 01/29/2018    CT Head/CTA Head and Neck 02/08/2023 1. Normal noncontrast CT of the head. 2. Moderate narrowing of the distal left P2 segment with tandem moderate narrowing in the right P2 segment.  3. Asymmetric attenuation of right PCA branch vessels. 4. Mild narrowing of proximal M2 branches bilaterally. 5. No other significant proximal stenosis, aneurysm, or branch vessel occlusion within the Circle of Willis. 6. Minimal atherosclerotic changes at the carotid bifurcations bilaterally without significant stenosis. 7. No significant stenosis of the vertebral arteries in the neck. 8. Multilevel spondylosis of the cervical spine    ASSESSMENT AND PLAN  73 y.o. year old female with hypertension and anxiety who is presenting after 2 episodes of headaches in June and October, no migrainous features, likely tension type headaches. Head CT with no acute findings, CTA with mild atherosclerosis. Advise her to try Tylenol  or Ibuprofen at home if she has another episode and to consider urgent care if pain not improved. Continue to follow up with PCP and return as needed.    1. Episodic tension-type headache, not intractable     Patient Instructions  Follow up with PCP  If you do have another headaches, please take Tylenol  1000 mg or Aleve  440 mg at home. If pain persist contact you PCP or go to UC.  Return as needed    No orders of the defined types were placed in this encounter.   No orders of the defined types were placed in this encounter.   Return if symptoms worsen or fail to improve.    Pastor Falling, MD 05/20/2023, 8:52  AM  [facility] [address] [place], [place] [address] [phone]

## 2023-07-28 ENCOUNTER — Ambulatory Visit: Payer: Self-pay | Admitting: Neurology

## 2023-09-10 ENCOUNTER — Other Ambulatory Visit: Payer: Self-pay | Admitting: Internal Medicine

## 2023-09-10 DIAGNOSIS — Z1231 Encounter for screening mammogram for malignant neoplasm of breast: Secondary | ICD-10-CM

## 2023-10-08 ENCOUNTER — Ambulatory Visit

## 2023-10-09 ENCOUNTER — Ambulatory Visit
Admission: RE | Admit: 2023-10-09 | Discharge: 2023-10-09 | Disposition: A | Discharge status: Home / Self Care | Source: Ambulatory Visit | Attending: Internal Medicine | Admitting: Internal Medicine

## 2023-10-09 DIAGNOSIS — Z1231 Encounter for screening mammogram for malignant neoplasm of breast: Secondary | ICD-10-CM

## 2024-04-19 DIAGNOSIS — D3A8 Other benign neuroendocrine tumors: Secondary | ICD-10-CM

## 2024-04-22 ENCOUNTER — Inpatient Hospital Stay: Payer: Medicare HMO

## 2024-04-22 ENCOUNTER — Inpatient Hospital Stay: Payer: Medicare HMO | Admitting: Hematology and Oncology

## 2024-05-17 ENCOUNTER — Inpatient Hospital Stay

## 2024-05-17 ENCOUNTER — Inpatient Hospital Stay: Admitting: Hematology and Oncology

## 2024-06-10 ENCOUNTER — Inpatient Hospital Stay

## 2024-06-10 ENCOUNTER — Inpatient Hospital Stay: Admitting: Hematology and Oncology
